# Patient Record
Sex: Male | Born: 1935 | Race: White | Hispanic: No | Smoking: Never smoker
Health system: Southern US, Community
[De-identification: ages and names within clinical notes are randomized; demographics above are authoritative.]

## PROBLEM LIST (undated history)

## (undated) DIAGNOSIS — Z9289 Personal history of other medical treatment: Secondary | ICD-10-CM

## (undated) DIAGNOSIS — I252 Old myocardial infarction: Secondary | ICD-10-CM

## (undated) DIAGNOSIS — E785 Hyperlipidemia, unspecified: Secondary | ICD-10-CM

## (undated) DIAGNOSIS — I10 Essential (primary) hypertension: Secondary | ICD-10-CM

## (undated) DIAGNOSIS — I2510 Atherosclerotic heart disease of native coronary artery without angina pectoris: Secondary | ICD-10-CM

## (undated) DIAGNOSIS — I4891 Unspecified atrial fibrillation: Secondary | ICD-10-CM

## (undated) DIAGNOSIS — I639 Cerebral infarction, unspecified: Secondary | ICD-10-CM

## (undated) HISTORY — DX: Atherosclerotic heart disease of native coronary artery without angina pectoris: I25.10

## (undated) HISTORY — DX: Personal history of other medical treatment: Z92.89

## (undated) HISTORY — DX: Unspecified atrial fibrillation: I48.91

## (undated) HISTORY — DX: Cerebral infarction, unspecified: I63.9

## (undated) HISTORY — DX: Hyperlipidemia, unspecified: E78.5

## (undated) HISTORY — DX: Essential (primary) hypertension: I10

## (undated) HISTORY — DX: Old myocardial infarction: I25.2

## (undated) HISTORY — PX: EYE SURGERY: SHX253

---

## 1998-08-20 HISTORY — PX: PROSTATE SURGERY: SHX751

## 2000-02-26 ENCOUNTER — Encounter: Payer: Self-pay | Admitting: Urology

## 2000-03-01 ENCOUNTER — Encounter (INDEPENDENT_AMBULATORY_CARE_PROVIDER_SITE_OTHER): Payer: Self-pay | Admitting: Specialist

## 2000-03-01 ENCOUNTER — Inpatient Hospital Stay (HOSPITAL_COMMUNITY): Admission: RE | Admit: 2000-03-01 | Discharge: 2000-03-03 | Payer: Self-pay | Admitting: Urology

## 2003-08-21 HISTORY — PX: HEMORRHOID SURGERY: SHX153

## 2004-07-12 ENCOUNTER — Inpatient Hospital Stay (HOSPITAL_COMMUNITY): Admission: EM | Admit: 2004-07-12 | Discharge: 2004-07-14 | Payer: Self-pay | Admitting: Emergency Medicine

## 2004-07-14 ENCOUNTER — Encounter: Payer: Self-pay | Admitting: Cardiology

## 2004-08-09 ENCOUNTER — Ambulatory Visit (HOSPITAL_COMMUNITY): Admission: RE | Admit: 2004-08-09 | Discharge: 2004-08-09 | Payer: Self-pay | Admitting: Cardiology

## 2007-09-26 ENCOUNTER — Encounter (INDEPENDENT_AMBULATORY_CARE_PROVIDER_SITE_OTHER): Payer: Self-pay | Admitting: General Surgery

## 2007-09-26 ENCOUNTER — Ambulatory Visit (HOSPITAL_COMMUNITY): Admission: RE | Admit: 2007-09-26 | Discharge: 2007-09-26 | Payer: Self-pay | Admitting: General Surgery

## 2008-09-17 ENCOUNTER — Inpatient Hospital Stay (HOSPITAL_COMMUNITY): Admission: EM | Admit: 2008-09-17 | Discharge: 2008-09-20 | Payer: Self-pay | Admitting: Emergency Medicine

## 2008-09-17 DIAGNOSIS — I252 Old myocardial infarction: Secondary | ICD-10-CM

## 2008-09-17 HISTORY — PX: CORONARY ANGIOPLASTY: SHX604

## 2008-09-17 HISTORY — DX: Old myocardial infarction: I25.2

## 2008-10-07 ENCOUNTER — Encounter (HOSPITAL_COMMUNITY): Admission: RE | Admit: 2008-10-07 | Discharge: 2009-01-05 | Payer: Self-pay | Admitting: Cardiology

## 2010-06-14 DIAGNOSIS — Z9289 Personal history of other medical treatment: Secondary | ICD-10-CM

## 2010-06-14 HISTORY — DX: Personal history of other medical treatment: Z92.89

## 2010-06-29 HISTORY — PX: TRANSTHORACIC ECHOCARDIOGRAM: SHX275

## 2010-11-30 LAB — GLUCOSE, CAPILLARY
Glucose-Capillary: 76 mg/dL (ref 70–99)
Glucose-Capillary: 128 mg/dL — ABNORMAL HIGH (ref 70–99)
Glucose-Capillary: 135 mg/dL — ABNORMAL HIGH (ref 70–99)
Glucose-Capillary: 148 mg/dL — ABNORMAL HIGH (ref 70–99)

## 2010-12-04 LAB — CBC
HCT: 30 % — ABNORMAL LOW (ref 39.0–52.0)
HCT: 32.2 % — ABNORMAL LOW (ref 39.0–52.0)
HCT: 33.7 % — ABNORMAL LOW (ref 39.0–52.0)
HCT: 35 % — ABNORMAL LOW (ref 39.0–52.0)
Hemoglobin: 10.2 g/dL — ABNORMAL LOW (ref 13.0–17.0)
Hemoglobin: 10.6 g/dL — ABNORMAL LOW (ref 13.0–17.0)
Hemoglobin: 11.4 g/dL — ABNORMAL LOW (ref 13.0–17.0)
Hemoglobin: 11.9 g/dL — ABNORMAL LOW (ref 13.0–17.0)
MCHC: 33.8 g/dL (ref 30.0–36.0)
MCHC: 33.9 g/dL (ref 30.0–36.0)
MCHC: 34 g/dL (ref 30.0–36.0)
MCV: 95.6 fL (ref 78.0–100.0)
MCV: 97.8 fL (ref 78.0–100.0)
Platelets: 107 10*3/uL — ABNORMAL LOW (ref 150–400)
Platelets: 145 10*3/uL — ABNORMAL LOW (ref 150–400)
RBC: 3.14 MIL/uL — ABNORMAL LOW (ref 4.22–5.81)
RBC: 3.45 MIL/uL — ABNORMAL LOW (ref 4.22–5.81)
RBC: 3.67 MIL/uL — ABNORMAL LOW (ref 4.22–5.81)
RDW: 14 % (ref 11.5–15.5)
RDW: 14.1 % (ref 11.5–15.5)
RDW: 14.3 % (ref 11.5–15.5)
WBC: 5.1 10*3/uL (ref 4.0–10.5)
WBC: 6.1 10*3/uL (ref 4.0–10.5)
WBC: 6.2 10*3/uL (ref 4.0–10.5)

## 2010-12-04 LAB — COMPREHENSIVE METABOLIC PANEL
ALT: 14 U/L (ref 0–53)
ALT: 17 U/L (ref 0–53)
AST: 40 U/L — ABNORMAL HIGH (ref 0–37)
AST: 60 U/L — ABNORMAL HIGH (ref 0–37)
Albumin: 3.2 g/dL — ABNORMAL LOW (ref 3.5–5.2)
Alkaline Phosphatase: 48 U/L (ref 39–117)
Alkaline Phosphatase: 49 U/L (ref 39–117)
BUN: 17 mg/dL (ref 6–23)
CO2: 23 mEq/L (ref 19–32)
CO2: 24 mEq/L (ref 19–32)
Calcium: 8 mg/dL — ABNORMAL LOW (ref 8.4–10.5)
Chloride: 106 mEq/L (ref 96–112)
Chloride: 106 mEq/L (ref 96–112)
Creatinine, Ser: 1.11 mg/dL (ref 0.4–1.5)
GFR calc Af Amer: >60 mL/min (ref >60)
GFR calc Af Amer: >60 mL/min (ref >60)
GFR calc non Af Amer: >60 mL/min (ref >60)
GFR calc non Af Amer: >60 mL/min (ref >60)
Glucose, Bld: 111 mg/dL — ABNORMAL HIGH (ref 70–99)
Potassium: 3.3 mEq/L — ABNORMAL LOW (ref 3.5–5.1)
Sodium: 136 mEq/L (ref 135–145)
Sodium: 137 mEq/L (ref 135–145)
Total Bilirubin: 1.5 mg/dL — ABNORMAL HIGH (ref 0.3–1.2)
Total Bilirubin: 1.5 mg/dL — ABNORMAL HIGH (ref 0.3–1.2)
Total Protein: 5.2 g/dL — ABNORMAL LOW (ref 6.0–8.3)

## 2010-12-04 LAB — CK TOTAL AND CKMB (NOT AT ARMC)
CK, MB: 32.6 ng/mL — ABNORMAL HIGH (ref 0.3–4.0)
CK, MB: 70.5 ng/mL — ABNORMAL HIGH (ref 0.3–4.0)
CK, MB: 74.4 ng/mL — ABNORMAL HIGH (ref 0.3–4.0)
Relative Index: 10.8 — ABNORMAL HIGH (ref 0.0–2.5)
Relative Index: 11.5 — ABNORMAL HIGH (ref 0.0–2.5)
Relative Index: 6.5 — ABNORMAL HIGH (ref 0.0–2.5)
Total CK: 505 U/L — ABNORMAL HIGH (ref 7–232)
Total CK: 614 U/L — ABNORMAL HIGH (ref 7–232)
Total CK: 687 U/L — ABNORMAL HIGH (ref 7–232)

## 2010-12-04 LAB — BASIC METABOLIC PANEL
BUN: 23 mg/dL (ref 6–23)
CO2: 26 mEq/L (ref 19–32)
Calcium: 8.6 mg/dL (ref 8.4–10.5)
Chloride: 105 mEq/L (ref 96–112)
Creatinine, Ser: 1.39 mg/dL (ref 0.4–1.5)
GFR calc Af Amer: 59 mL/min — ABNORMAL LOW (ref >60)
GFR calc non Af Amer: 49 mL/min — ABNORMAL LOW (ref >60)
Glucose, Bld: 118 mg/dL — ABNORMAL HIGH (ref 70–99)
Potassium: 3.8 mEq/L (ref 3.5–5.1)
Sodium: 138 mEq/L (ref 135–145)

## 2010-12-04 LAB — TROPONIN I
Troponin I: 18.28 ng/mL (ref 0.00–0.06)
Troponin I: 27.47 ng/mL (ref 0.00–0.06)
Troponin I: 33.1 ng/mL (ref 0.00–0.06)

## 2010-12-04 LAB — PROTIME-INR
INR: 1.9 — ABNORMAL HIGH (ref 0.00–1.49)
INR: 2.1 — ABNORMAL HIGH (ref 0.00–1.49)
INR: 2.3 — ABNORMAL HIGH (ref 0.00–1.49)
Prothrombin Time: 23.1 seconds — ABNORMAL HIGH (ref 11.6–15.2)
Prothrombin Time: 24.9 seconds — ABNORMAL HIGH (ref 11.6–15.2)
Prothrombin Time: 26.7 seconds — ABNORMAL HIGH (ref 11.6–15.2)

## 2010-12-04 LAB — GLUCOSE, CAPILLARY
Glucose-Capillary: 100 mg/dL — ABNORMAL HIGH (ref 70–99)
Glucose-Capillary: 110 mg/dL — ABNORMAL HIGH (ref 70–99)
Glucose-Capillary: 121 mg/dL — ABNORMAL HIGH (ref 70–99)
Glucose-Capillary: 133 mg/dL — ABNORMAL HIGH (ref 70–99)
Glucose-Capillary: 156 mg/dL — ABNORMAL HIGH (ref 70–99)
Glucose-Capillary: 90 mg/dL (ref 70–99)
Glucose-Capillary: 92 mg/dL (ref 70–99)

## 2010-12-04 LAB — HEMOGLOBIN AND HEMATOCRIT, BLOOD: Hemoglobin: 10.3 g/dL — ABNORMAL LOW (ref 13.0–17.0)

## 2010-12-04 LAB — POCT CARDIAC MARKERS
CKMB, poc: <1 ng/mL — ABNORMAL LOW (ref 1.0–8.0)
Myoglobin, poc: 108 ng/mL (ref 12–200)
Troponin i, poc: <0.05 ng/mL (ref 0.00–0.09)

## 2010-12-04 LAB — APTT: aPTT: 29 seconds (ref 24–37)

## 2010-12-05 LAB — GLUCOSE, CAPILLARY
Glucose-Capillary: 84 mg/dL (ref 70–99)
Glucose-Capillary: 84 mg/dL (ref 70–99)
Glucose-Capillary: 91 mg/dL (ref 70–99)
Glucose-Capillary: 135 mg/dL — ABNORMAL HIGH (ref 70–99)
Glucose-Capillary: 90 mg/dL (ref 70–99)

## 2010-12-05 LAB — LIPID PANEL
HDL: 43 mg/dL (ref >39)
LDL Cholesterol: 53 mg/dL (ref 0–99)
Total CHOL/HDL Ratio: 2.4 RATIO
VLDL: 9 mg/dL (ref 0–40)

## 2010-12-05 LAB — CBC
Hemoglobin: 10.4 g/dL — ABNORMAL LOW (ref 13.0–17.0)
RBC: 3.21 MIL/uL — ABNORMAL LOW (ref 4.22–5.81)
RDW: 13.9 % (ref 11.5–15.5)

## 2010-12-05 LAB — COMPREHENSIVE METABOLIC PANEL
ALT: 15 U/L (ref 0–53)
AST: 25 U/L (ref 0–37)
Alkaline Phosphatase: 48 U/L (ref 39–117)
CO2: 24 mEq/L (ref 19–32)
Glucose, Bld: 85 mg/dL (ref 70–99)
Potassium: 3.5 mEq/L (ref 3.5–5.1)
Sodium: 138 mEq/L (ref 135–145)
Total Protein: 5.4 g/dL — ABNORMAL LOW (ref 6.0–8.3)

## 2010-12-05 LAB — MAGNESIUM: Magnesium: 2.1 mg/dL (ref 1.5–2.5)

## 2011-01-02 NOTE — Discharge Summary (Signed)
Craig Richard, Craig Richard NO.:  [id number]   MEDICAL RECORD NO.:  [id number]          PATIENT TYPE:  INP   LOCATION:  2006                         FACILITY:  [facility]   PHYSICIAN:  Nanetta Batty, M.D.   DATE OF BIRTH:  [birth year]   DATE OF ADMISSION:  09/17/2008  DATE OF DISCHARGE:  09/20/2008                               DISCHARGE SUMMARY   DISCHARGE DIAGNOSES:  1. ST elevation anterior myocardial infarction.  2. Coronary artery disease single-vessel of left anterior descending,      undergoing percutaneous transluminal coronary angioplasty only with      100% stenosis of the left anterior descending.  3. Residual 50% left anterior descending stenosis.  4. Mild hypokinesis of left ventricular ejection fraction of 40% at      cardiac catheterization.  5. Chronic atrial fibrillation, on Coumadin therapy.  6. Dyslipidemia, treated and controlled.  7. Non-insulin dependent diabetes mellitus 2, stable.  8. Hypertension.  9. Tachycardia with ambulation, now resolved with increased dose of      beta-blocker.  10.Hypokalemia, resolved.  11.Thrombocytopenia.  Unsure, if this is secondary to heparin,      anticoagulation, or if this is an old problem.  12.Mild dysmotility secondary to myocardial infarction.  Family to      assist with care of his wife.   DISCHARGE MEDICATIONS:  1. Aspirin 81 mg daily.  2. Stop atenolol.  3. Lopressor 50 mg tablet 1-1/2 tablets twice a day.  4. Imdur 60 mg once daily.  5. Actos 15 mg daily.  6. Lipitor 40 mg 1 every other day.  7. Altace 10 mg daily.  8. Coumadin 5 mg daily except 2.5 mg Monday, Wednesday, and Friday.  9. Nitroglycerin 1/150, under the tongue as needed for chest pain      while sitting.  No more than 1 every 5 minutes up to 3/15 minutes.      If no relief, call 911.  10.Coumadin level drawn on Wednesday, home health will draw.   Please have your wife's physician call home health care consult for her  to assist with  your care while you are unable to manage.   DISCHARGE INSTRUCTIONS:  1. Low-sodium, heart-healthy, and diabetic diet.  2. Wash cath site with soap and water.  Call if any bleeding,      swelling, or drainage.  3. Increase activity slowly.  May shower.  No lifting for 2 weeks.  No      driving for 2 weeks.  4. Follow up with Dr. Aleen Campi, September 30, 2008 at 4 p.m.   HISTORY OF PRESENT ILLNESS:  This is an 77 year old gentleman presented  to the emergency room on September 17, 2008 with complaints of chest pain.  Pain developed after he had taken his wife back to bed.  After helping  her to the bathroom substernal chest pain, discomfort, and diaphoresis;  911 was called and pain was 8/10 initially.  Originally, his code STEMI  was called, but cancelled by the ER physician.  Chest pain was down to  1/10 after 3 sublingual nitro, but repeat EKG continued to  reveal 1 mm  ST elevation.  Dr. Allyson Sabal was notified.  The patient was placed on IV  nitroglycerin, IV Lopressor was given, aspirin and morphine.  The  patient was taken to the cath lab for urgent cardiac cath.  INR was 1.9.   ALLERGIES:  No known allergies.   PAST MEDICAL HISTORY:  1. He has chronic atrial fibrillation.  2. Hypertension.  3. Elevated cholesterol.  4. Diabetes mellitus.  5. He is having large prostate with surgery and hemorrhoidectomy in      the past.   FAMILY HISTORY:  No coronary artery disease and the patient himself has  never had cardiac problems.   SOCIAL HISTORY:  He stopped tobacco 40 years ago.  He is care Nathyn Luiz  for his wife and has to do physical activity to assist her.   FAMILY HISTORY/REVIEW OF SYSTEMS:  See H and P.   PHYSICAL EXAMINATION ON DISCHARGE:  VITAL SIGNS:  Blood pressure 129/86,  pulse 81, and oxygen saturation good.  With ambulation, his pulse did go  up to 148 with initial ambulation.  We did increase his Lopressor to 75  b.i.d. with followup ambulation.  After the increased dose  of  medication, his heart rate stayed 100-115 at that point.  Cardiac rehab  did ambulate him initially and discussed Cardiac Rehab proper use of  sublingual nitroglycerin, 911, etc.  HEART:  Irregularly irregular.  LUNGS:  Clear.   LABORATORY DATA:  Hemoglobin on admission 11.9 with hematocrit of 35,  WBC 5.1, and platelets were 145.  With the anticoagulation, platelets  dropped to 107 after Angiomax.  Heparin was not restarted because follow  up INRs were elevated at 2.32.  When the INR dropped for sheath pulling,  Coumadin was restarted.  Hemoglobin at discharge 10.4, hematocrit 30.9,  WBC 5.4, and platelets 112.   CHEMISTRY:  Sodium 138.  On admission; potassium 3.8, chloride 105, CO2  of 26, BUN 23, creatinine 1.39, and glucose 118.  At discharge; sodium  138, potassium 3.5, chloride 106, CO2 of 24, BUN 16, creatinine 1.2, and  glucose 85.   Coags on admission; INR was 1.9, the lowest we got during  hospitalization was 1.9 on the September 18, 2008, and at discharge  Protime 30.3 and INR of 2.7.   Magnesium level was 2.1 and calcium 8.2.  BNP was 390.   Chest x-ray on admission; cardiomegaly with bowel to acute disease.   Accu-Cheks during hospitalization were fairly well controlled mostly  less than 120.   EKGs.  Multiple EKGs were done, but initial and shows more elevated T-  wave in lead V4-5 and an ST elevation in lead II, III, and aVF.  He also  has a reciprocal or inverted T-wave in lead V2.   Continued EKGs revealed elevated STs in II, III, aVF, and somewhat more  so in V4 and V5.  These continued to progress.  Please note, all these  were atrial fibrillation as well.   Postprocedure EKG with an inverted T-waves in V3-V4.  T-wave in lead  III, which actually has been present.  Last EKG was, September 20, 2008,  inferior infarction, T-wave inversions in V4, V5, and V6.   HOSPITAL COURSE:  The patient was admitted by Dr. Allyson Sabal on-call for Dr.  Aleen Campi.  After  presenting by EMS with chest pain, mostly relieved with  nitroglycerin, but continued 1/10.  EKG continue with ST elevation.  The  patient was taken emergently to the cath lab  for cardiac catheterization  and found to have 100% stenosis of LAD, more distal underwent  angioplasty; however, the patient with 85 and has a history of chronic  atrial fibrillation.  The patient tolerated the procedure well and was  admitted to the intensive care unit where he slowly progressed over the  next 3 days.  Sheath was held for a 24 hours secondary to elevated INR.  It was pulled on September 18, 2008, and the patient was up in the chair  later that day.   MEDICATIONS:  Beta-blocker had been started.  His Lipitor continued.  Altace, we will increase the dose once his creatinine stayed stable  after his cardiac catheterization.   He continued to improve by September 20, 2008.  He ambulated.  Heart rate  did increase.  His Lopressor was adjusted and by the afternoon of  September 20, 2008, he was stable ready for discharge.  His daughters were  here to take him home.  They actually have contacted home health for  their mother's assistance, so that he will not have to do any heavy work  while he is recuperating.  He does plan to attend cardiac rehab phase  II.  He will follow up as an outpatient with Dr. Aleen Campi, his primary  cardiologist.      Darcella Gasman. Annie Paras, N.P.      Nanetta Batty, M.D.  Electronically Signed    LRI/MEDQ  D:  09/20/2008  T:  09/21/2008  Job:  [id number]   cc:   Nanetta Batty, M.D.  Antionette Char, MD  Georgianne Fick, M.D.

## 2011-01-02 NOTE — Op Note (Signed)
Craig Richard, Craig Richard             ACCOUNT NO.:  [id number]   MEDICAL RECORD NO.:  [id number]          PATIENT TYPE:  AMB   LOCATION:  DAY                          FACILITY:  [facility]   PHYSICIAN:  Timothy E. Earlene Plater, M.D. DATE OF BIRTH:  May 27, 1922   DATE OF PROCEDURE:  09/26/2007  DATE OF DISCHARGE:                               OPERATIVE REPORT   PREOPERATIVE DIAGNOSES:  Internal and external hemorrhoids.   POSTOPERATIVE DIAGNOSES:  Internal and external hemorrhoids.   PROCEDURE:  Hemorrhoidectomy, simple.   ANESTHESIA:  [place].   SURGEON:  Lorelee New, MD.   [name] is a long-term patient of mine. I have seen him and treated  him of for hemorrhoids. We have in the past managed to take care of  things with office procedures however, he has progressed now with a  prolapsing hemorrhoids which cause pain, soilage, bleeding, difficulty  cleansing and after careful discussion, he wishes to proceed with  surgery. We have talked about that in detail. Also because of this  medical conditions and chronic Coumadin that will have to be adjusted  very carefully as well.  He agrees and understands. He presents today.  He is identified and permit signed. His Coumadin and Lovenox have been  handled appropriately.  He is ready for surgery.   The patient is taken to the operating room, placed supine,  IV sedation  given.  He was gently placed in lithotomy.  The perianal area was  inspected, prepped and draped in the usual fashion.  The anus was  injected around and about with Marcaine, epinephrine and Wydase and the  anus gently dilated.  With the operating anoscope in place, there was a  third-degree anterior hemorrhoid but without any anodermal or external  component and a third-degree right posterior with skin and anodermal  components. The left side was clear.  I put a band on the anterior  hemorrhoids since it was purely internal. It fit nicely and there were  no complications. I  excised the right posterior hemorrhoid in its  entirety,  undermined the edges,  removed the superficial varicosities  and closed the wound with a running 2-0 chromic. Again no bleeding, no  complication.  Gelfoam gauze wrapped in Surgicel was placed in the anal  canal and he was removed to the recovery room in good condition.  All  counts correct.   Prior to anesthesia, I went over the details of the care again with him  carefully, wrote them down for him carefully, gave him Vicodin for pain.  He will resume Lovenox in 2 days and Coumadin in 7.      Timothy E. Earlene Plater, M.D.  Electronically Signed    TED/MEDQ  D:  09/26/2007  T:  09/27/2007  Job:  [id number]   cc:   [facility]

## 2011-01-02 NOTE — Cardiovascular Report (Signed)
NAMEGILDARDO, Craig Richard NO.:  [id number]   MEDICAL RECORD NO.:  [id number]          PATIENT TYPE:  INP   LOCATION:  2311                         FACILITY:  [facility]   PHYSICIAN:  Nanetta Batty, M.D.   DATE OF BIRTH:  Jun 12, 1922   DATE OF PROCEDURE:  DATE OF DISCHARGE:                            CARDIAC CATHETERIZATION   Craig Richard is a delightful 77 year old married white male, history of  chronic AFib on Coumadin anticoagulation.  He developed chest pain early  this morning and was brought to [facility] where he was found to have  subtle ST-segment elevation in the inferior leads.  His INR was 1.9.  He  was treated with IV nitro, morphine, and Lopressor.  His pain for the  most part subsided, though this ST-segment elevation appeared to be more  pronounced.  He presents now for urgent cardiac catheterization and  potential intervention.   DESCRIPTION OF PROCEDURE:  The patient was brought to the Second Floor  [facility] Cardiac Cath Lab urgently in the postabsorptive state.  He  was not premedicated.  His right groin was prepped and shaved in the  usual sterile fashion.  Xylocaine 1% was used for local anesthesia.  A 6-  French sheath was inserted into the right femoral artery using standard  Seldinger technique.  A 6-French right and left Judkins diagnostic  catheters as well as a 6-French pigtail catheter were used for selective  cholangiography and left ventriculography respectively.  Visipaque dye  was used for the entirety of the case.  Retrograde aortic, ventricular,  and pulmonic blood pressures were recorded.   HEMODYNAMICS:  1. Aortic systolic pressure 126, diastolic pressure 73.  2. Left ventricular systolic pressure 134 and diastolic pressure 12.  3. Selective coronary angiography.      a.     Left main normal.      b.     LAD; the LAD had a 50% hypodense lesion in the midportion at       the takeoff of the second moderate-sized diagonal branch.   The       distal LAD just above the apex was abruptly occluded.      c.     Circumflex; free of significant disease.      d.     Right coronary artery; dominant and free of significant       disease.  4. Left ventriculography; RAO left ventriculogram was performed using      35 mL of Visipaque dye 12 mL per second.  There was anteroapical      and inferoapical akinesia with EF in the 40-45% range.   IMPRESSION:  Craig Richard has distal LAD infarct in a wraparound LAD  accounting for his inferior ST-segment elevation with anteroapical  akinesia.  This appears to be abrupt cutoff and looks more  thromboembolic than does atherosclerotic.  We will proceed with  percutaneous intervention to restore antegrade flow and preserve his  apex.   DESCRIPTION OF PROCEDURE:  The patient received aspirin, Plavix 600, and  Angiomax bolus with an ACT of 405.  Using a 6-French JL  3.5 guide  catheter along with and 0.014 x 190 [employer] soft wire and a 2.0 x 12 apex  balloon.  PTCA was performed of the apical LAD at low pressures (3  atmospheres).  This resulted in restoration of the antegrade flow.  There was no dissection or extravasation.  The patient tolerated the  procedure well.   IMPRESSION:  Successful distal apical left anterior descending coronary  artery percutaneous transluminal coronary angioplasty in the setting of  an inferior ST-elevation myocardial infarction secondary to a wraparound  left anterior descending coronary artery.   The vessel was too small to stent.  The guidewire and catheter were  removed.  The sheath was then secured in place.  Angiomax was turned  off.  The sheath will be removed later this afternoon given the INR of  1.9.  He will be treated with the usual drugs including beta-blocker,  statin, and ACE inhibitor.  Since the stent was not deployed, I believe  that continued coumadinization and aspirin would be appropriate.  Coumadin is on hold and heparin will be  restarted 4 hours after sheath  removal and the patient will ultimately need to re-coumadinized.   The patient left the lab in stable condition.      Nanetta Batty, M.D.  Electronically Signed     JB/MEDQ  D:  09/17/2008  T:  09/17/2008  Job:  [id number]   cc:   [facility]  Second Floor [facility] Cardiac Catheterization Laboratory  Antionette Char, MD

## 2011-01-05 NOTE — H&P (Signed)
NAMEAVIGDOR, Craig Richard NO.:  [id number]   MEDICAL RECORD NO.:  [id number]          PATIENT TYPE:  EMS   LOCATION:  MAJO                         FACILITY:  [facility]   PHYSICIAN:  Hettie Holstein, D.O.    DATE OF BIRTH:  June 18, 1922   DATE OF ADMISSION:  07/12/2004  DATE OF DISCHARGE:                                HISTORY & PHYSICAL   PRIMARY CARE PHYSICIAN:  Georgianne Fick, M.D.   CARDIOLOGIST:  Antionette Char, MD   CHIEF COMPLAINT:  Transfer from Urgent Care Center for which she had  initially presented with a fall due to lower extremity weakness, acute.   HISTORY OF PRESENT ILLNESS:  This is a pleasant 77 year old Caucasian male  with good functional status who presented to the Urgent Care Center today  after he had fallen while ambulating through his hallway at home. He states  that all of a sudden his legs felt weak and he had no complaints of  dizziness, lightheadedness, chest pain, palpitations, or shortness of  breath. He simply felt weak. This is transient, only lasting a few minutes.  He was subsequently able to get up off the floor. He had no slurred speech  or drooling reported by his wife who was there, and no specific asymmetrical  weakness. His wife is wheelchair bound and presented to help him off the  floor. He subsequently presented to the Urgent Care Center at which time Dr.  Cleta Alberts assessed him and noted him to have an irregular heart rate and directed  him to [facility] ER for admission. In the emergency department he was noted  to have an irregular heart rate in addition to atrial fibrillation on his  EKG. He reports having had a recent stress test about a few weeks ago per  Dr. Charolette Child. He was told that he had slight risk, but we do not have  these records available at this time. I am going to request these be faxed  to our office from Dr. Adelene Idler and Dr. Carolyn Stare office when they  receive this dictation. In any event, he has no  complaints at this time. He  is able to stand. He has no focal weakness. He is being admitted for further  evaluation and management.   PAST MEDICAL HISTORY:  Hypertension for a year, on treatment. Diabetes  diagnosed for a year without known retinopathy, neuropathy, or nephropathy.  He follows his CBGs on a weekly basis and he has a history of  hypercholesterolemia for about one and a half years.  He had a recent stress  test about six weeks ago.  He was told it was slight risk and to follow up  in one year with Dr. Aleen Campi. He has had prostate surgery about three years  ago by Dr. Logan Bores. He has a history of colonoscopy three to four years ago by  Dr. Scotty Court with no significant findings according to him.  His  immunization history is as follows: He typically has a flu shot yearly, but  he has not had one this year. Pneumovax--he seems to think he has had  one  about one or two years ago, but he cannot recall.   MEDICATIONS:  He currently takes Avandia 4 mg daily, aspirin 81 mg daily,  Lipitor 20 mg every other day, atenolol 50 mg daily.   ALLERGIES:  He has no known drug allergies.   SOCIAL HISTORY:  He is a nonsmoker. He does not drink alcohol.  He is  retired. He formerly worked at a company called [employer]. His  wife is wheelchair bound. He does have two children, one daughter  accompanying him now.   FAMILY HISTORY:  Significant for mother who died in her 12s of a CVA. Father  died in his mid 63s of an abdominal aortic aneurysm.   REVIEW OF SYSTEMS:  He denies any nausea, vomiting, diarrhea, chest pain,  shortness of breath. He had some slight palpitations, though this is not  prominent. He cannot recall when he first noticed these. He had no abdominal  pain. No hematochezia, melena, coffee-ground emesis, lower extremity,  swelling, or edema that he can recall. No calf tenderness. No dysuria.   PHYSICAL EXAMINATION:  VITAL SIGNS: Blood pressure 163/83, heart rate  in the  80s and 90 and irregular, O2 saturation 98% on room air.  GENERAL: Alert and oriented times four, in no acute distress.  CARDIAC: Heart rate irregular with normal S1 and S2. No appreciable murmurs.  LUNGS: Clear to auscultation bilaterally.  ABDOMEN: Soft, nontender. Obese. No hepatosplenomegaly. No suprapubic  tenderness. No costovertebral angle tenderness.  EXTREMITIES: No edema. No clubbing, cyanosis, or edema. Peripheral pulses  are symmetrical and palpable throughout.  NEUROLOGIC: No focal neurologic deficit. He is euthymic and answers all  questions appropriately.   LABORATORY DATA:  Point of care at 17:03 was negative. His INR was 1.0. WBC  is 5.9, hemoglobin 12.6, platelet count 172,000, MCV 93. Chemistry reveals  sodium 138, potassium 4.1, BUN 16, creatinine 1.1, glucose 102. His AST and  ALT were within normal limits. His BNP was mildly elevated in the 200s.   Chest x-ray revealed some cardiomegaly with mild vascular congestion,  questionable interstitial edema, right lower lobe nodular density,  questionable on portable x-ray and recommend two-view to clarify. EKG  revealed atrial fibrillation/flutter with controlled rate.   IMPRESSION:  1.  New onset atrial fibrillation. Rate is controlled. We will initiate low-      molecular weight and therapeutic Coumadin.  2.  Transient leg weakness. Certainly this could have been a transient      ischemic attack. We will check his CT scan. Rule out acute      cerebrovascular accident prior to administering Lovenox.  3.  Diabetes mellitus. Appears to be controlled. Will continue his home      regimen.  4.  Hypercholesterolemia. Will continue his Lipitor.  5.  Hypertension, poorly controlled. Will initiate Altace.   PLAN:  At this time we are going to admit Mr. Dorian to telemetry and  follow his course clinically and initiate low-molecular weight heparin bridge to a therapeutic INR. We will order hemoglobin A1C, fasting  lipid  profile, and 2-D echocardiogram. His BMP is mildly elevated to 200 and his  chest x-ray reveals some mild vascular congestion. Will order a 2-D  echocardiogram to further evaluate his LV function.      Eric   ESS/MEDQ  D:  07/12/2004  T:  07/12/2004  Job:  [id number]   cc:   Georgianne Fick, M.D.  [address]  [place]  [place]  [address]  Fax: [phone]

## 2011-01-05 NOTE — H&P (Signed)
[facility]  Patient:    Craig Richard, Craig Richard                    MRN: [id number] Proc. Date: 03/01/00 Adm. Date:  [id number] Disc. Date: [id number] Attending:  Londell Moh CCLeroy Sea., M.D.                         History and Physical  ADMITTING DIAGNOSIS:  Benign prostatic hypertrophy with bladder outlet obstruction.  SECONDARY DIAGNOSIS: Chronic prostatitis.  PROCEDURE:  Cystoscopy and transurethral resection of the prostate.  SURGEON:  Jamison Neighbor, M.D.  HISTORY OF PRESENT ILLNESS:  This 77 year old male has had problems with bladder outlet obstruction, as well as an elevated PSA.  The patient was treated for chronic prostatitis.  The patients ultrasound showed a large volume of 86, and because of the low PSA density and high PSA too, it was felt that a biopsy was not indicated.  The patient had significant bladder outlet symptoms, not responsive to treatment like saw palmetto.  The patient was treated with Proscar, with only minimal improvement.  The patient strongly did not wish to have surgery.  He was carefully advised that TUNA, Targis, and other options were available.  The patient had a prolonged bout of prostatitis and urinary retention, and became quite frustrated, and spoke to his brother, a Development worker, international aid, who recommended a TURP, having had one himself.  The patient decided he would prefer to do this, as opposed to other options.  The patient is to be admitted following the TURP.  PAST MEDICAL/SURGICAL HISTORY:  Quite unremarkable.  The patient has no chronic  medical illness.  He takes nothing other than aspirin and vitamin E.  He has had no previous surgery.  The patient did have a hospitalization for atypical chest pain, which turned out to be from a hiatal hernia.  SOCIAL HISTORY:  He is married and has two grown children.  The social history s noncontributory.  The patient has not smoked in  30 years.  He has very rare alcohol.  FAMILY HISTORY:  His father died of an abdominal aortic aneurysm.  His mother died of a stroke.  There is a history of chronic obstructive pulmonary disease in the family (a sister), as well as a history of seizures in a daughter and his mother, who had a stroke.  PHYSICAL EXAMINATION:  GENERAL:  He is a well-developed, well-nourished male, in no acute distress.  HEENT:  Normocephalic, atraumatic.  Cranial nerves II-XII are grossly intact.  NECK:  Supple, no adenopathy or thyromegaly.  LUNGS:  Clear.  HEART:  A regular rate, rhythm without murmurs, thrills, gallops, rubs, or heaves.  ABDOMEN:  Soft, nontender.  No abdominal masses, rebound, or guarding.  GENITOURINARY:  Testicles are normal size and shape.  ______, cord structures are normal.  No evidence of hydrocele, spermatocele, varicocele, or hernia, or adenopathy.  RECTAL:  A 4+ prostate, slightly boggy from chronic prostatitis, but not hot or  nodular.  The sphincter tone was normal.  _______: Unremarkable vasculature.  NEUROLOGIC:  Normal.  IMPRESSION:  Benign prostatic hypertrophy by obstruction, and chronic prostatitis.  PLAN:  To admit following the transurethral resection of the prostate. DD:  03/01/00 TD:  03/01/00 Job: [id number]

## 2011-01-05 NOTE — Discharge Summary (Signed)
[facility]  Patient:    Craig Richard, Craig Richard                    MRN: [id number] Adm. Date:  [id number] Disc. Date: [id number] Attending:  Londell Moh CC:         Jamison Neighbor, M.D.             Leroy Sea., M.D.                           Discharge Summary  ADMITTING DIAGNOSES: 1. Benign prostatic hypertrophy with bladder outlet obstruction. 2. Prostatitis. 3. Hiatal hernia.  PRINCIPAL PROCEDURE:  Transurethral resection of the prostate done on March 01, 2000.  HISTORY:  This 77 year old male has had longstanding problems with bladder outlet obstruction.  The patient has been treated with oral agents without significant improvement.  He developed an episode of near retention during a recent trip and feels that he would like to undergo a TURP.  He was given other alternatives such as TUNA, ______ or prostatic stent and elected to undergo TURP.  He does understand that he has a very large gland that may require additional surgery.  He was admitted to the hospital following this TURP.  PAST MEDICAL HISTORY:  Patients past medical history is noncontributory and has been fully outlined in the initial history and physical.  HOSPITAL COURSE:  Patient was taken to the operating room on the day of admission where he underwent a successful TURP.  His postoperative course was unremarkable.  The patients urine cleared very nicely.  His Foley catheter was made ready for removal and on postoperative day #2, patient voided without difficulty after removal of the catheter and was discharged.  DISCHARGE INSTRUCTIONS:  He was given a prescription for pain medicine as well as antibiotics and to return in followup in two weeks time. DD:  03/12/00 TD:  03/15/00 Job: [id number]

## 2011-01-05 NOTE — Discharge Summary (Signed)
Craig Richard, Craig Richard NO.:  [id number]   MEDICAL RECORD NO.:  [id number]          PATIENT TYPE:  INP   LOCATION:  3705                         FACILITY:  [facility]   PHYSICIAN:  Mallory Shirk, MD     DATE OF BIRTH:  [birth year]   DATE OF ADMISSION:  07/12/2004  DATE OF DISCHARGE:  07/14/2004                                 DISCHARGE SUMMARY   PRIMARY CARE PHYSICIAN:  Antionette Char, M.D.   DISCHARGE DIAGNOSES:  1.  Atrial fibrillation, new onset.  2.  Diabetes mellitus.  3.  Hypertension.  4.  Transient weakness in lower extremities.   MEDICATIONS ON DISCHARGE:  1.  Avandia 4 mg p.o. daily.  2.  Atenolol 50 mg p.o. daily.  3.  Lipitor 20 mg p.o. daily.  4.  Aspirin 81 mg p.o. daily.  5.  Altace 2.5 mg p.o. daily.  6.  Lovenox 80 mg subcutaneously b.i.d. x 3 days.  7.  Coumadin 5 mg p.o. daily.  8.  Potassium chloride 20 mEq one tablet p.o. daily x 3 days.   FOLLOWUP APPOINTMENTS:  1.  Georgianne Fick, M.D., on Monday, July 17, 2004, for INR and BNP      check.  Dr. Nicholos Johns will adjust the Coumadin dose and write a new      prescription appropriately.  2.  Antionette Char, M.D., cardiology, in two weeks.  Dr. Adelene Idler office      will call with appointment.   HISTORY OF PRESENT ILLNESS:  Craig Richard is a very pleasant, 77 year old,  Caucasian gentleman with good functional status, who presented to urgent  care earlier today after he fell while ambulating through his hallway at  home.  He states that all of a sudden his legs felt weak.  No complaints of  dizziness, lightheadedness, chest pain, palpitations or shortness of breath.  No syncopal episodes.  He just simply felt weak, transient, lasting for a  few minutes.  He eased himself to the floor.  No trauma to the head at that  time.  He was subsequently able to get off of the floor on his own strength.  No slurred speech or drooling noted by his wife.  The patient presented to  urgent  care, at which time he was noted to have an irregular heart rate and  directed to the [facility] ER for evaluation.  In the ED, he was noted to  have atrial fibrillation which is of new onset.  He was seen by Antionette Char, M.D., a few weeks prior to this admission for a stress test.  He  was told he had a slight risk, but records are not available at this time.  At the time of examination, the patient had no difficulty in walking.  No  focal weakness was noted.  He was admitted for evaluation and further  management of the new onset atrial fibrillation.   PAST MEDICAL HISTORY:  1.  Hypertension.  2.  Diabetes mellitus.  3.  Hypercholesterolemia.   MEDICATIONS ON ADMISSION:  1.  Avandia 4 mg p.o. daily.  2.  Aspirin 81 mg p.o. daily.  3.  Lipitor 20 mg p.o. daily.  4.  Atenolol 50 mg p.o. daily.   ALLERGIES:  NKDA.   PHYSICAL EXAMINATION ON ADMISSION:  VITAL SIGNS:  Blood pressure 163/83,  pulse between 80-90 and irregular, O2 saturations 98% on room air.  GENERAL APPEARANCE:  An elderly Caucasian gentleman in no acute distress.  Alert and oriented x 3.  Cooperative with the exam.  HEENT:  Normocephalic and atraumatic.  PERRL.  NECK:  Supple.  No JVD.  No LAD.  LUNGS:  Clear to auscultation bilaterally.  No wheezes.  No rales.  CARDIOVASCULAR:  S1 plus S2.  Irregularly rate.  No murmurs, rubs or  gallops.  EXTREMITIES:  No cyanosis, clubbing or edema.  Good peripheral pulses  bilaterally.  NEUROLOGIC:  No focal neurologic deficits seen.   LABORATORIES ON ADMISSION:  Cardiac enzymes negative.  INR 1.0.  WBC 5.9,  hemoglobin 12.6, platelets 172,000, MCV 93.  Sodium 138, potassium 4.1, BUN  16, creatinine 1.0, glucose 102.  AST and ALT within normal limits.  BNP  206.   IMAGING:  Chest x-ray with cardiomegaly with some mild congestion and  questionable interstitial edema.   EKG with atrial fibrillation/flutter with rate control.   CT of the head negative for bleed or  other acute intracranial process, mild  diffuse parenchymal atrophy noted.   HOSPITAL COURSE:  The patient was admitted to a monitored bed.   #1 - ATRIAL FIBRILLATION:  The patient was started on Lovenox 85 mg  subcutaneously b.i.d. and Coumadin 5 mg p.o. daily.  On the day of  discharge, the patient's INR is 1.2 with a PTT of 14.6.  The patient will be  discharged with a three-day supply of Lovenox 80 mg subcutaneously b.i.d.  Pharmacy will provide education for the patient to use Lovenox.  Will be  seen by PCP, Dr. Nicholos Johns on Monday, July 17, 2004, for an INR  check.  Dr. Carolyn Stare office has been notified.  The patient's heart  rate was in the 80s-90s during his hospital stay with no events on the  monitor.   #2 - DIABETES MELLITUS:  The patient's Avandia was resumed.  He was also  placed on a sliding scale with Accu-Cheks a.c. and h.s.  His diabetes  mellitus was well controlled with an A1C of 6.7.   #3 - HYPERTENSION:  The patient's blood pressure was well controlled with  Altace and atenolol.   #4 - TRANSIENT LEG WEAKNESS:  Initially this was thought to be a TIA.  CT of  the head is negative.  The patient has had no episodes of weakness during  the hospital stay.  If this recurs, this may need further evaluation,  however, at this time no further evaluation is planned.   #5 - NODULAR OPACITY:  Chest x-ray showed a nodular opacity in the right  lung base.  The patient has no prior x-rays for comparison.  A short-term  interval followup chest x-ray has been recommended.  PCP, Dr. Nicholos Johns,  will do a followup chest x-ray in one month and evaluate further as needed.   The patient was advised to take his Coumadin and Lovenox as directed.  The  patient was also advised of the risks of Coumadin and told to avoid any  trauma or any activity that might leave him susceptible to trauma.  The patient understands these risks.  His wife has been on Coumadin for a  number  of years and he is aware of all of the possibilities.   The patient was also advised to return to the emergency department  immediately upon onset of weakness, dizziness, chest pain, shortness of  breath or any other symptoms that might need immediate medical attention.       GDK/MEDQ  D:  07/14/2004  T:  07/14/2004  Job:  [id number]   cc:   Georgianne Fick, M.D.  [address]  [place]  [place] [address]  Fax: [phone]   Antionette Char, MD  [address]  [place]  [place] [address]  Fax: [phone]

## 2011-01-05 NOTE — Discharge Summary (Signed)
NAMEMARCELLO, Craig Richard             ACCOUNT NO.:  [id number]   MEDICAL RECORD NO.:  [id number]          PATIENT TYPE:  INP   LOCATION:  3705                         FACILITY:  [facility]   PHYSICIAN:  Mallory Shirk, MD     DATE OF BIRTH:  Oct 19, 1921   DATE OF ADMISSION:  07/12/2004  DATE OF DISCHARGE:                                 DISCHARGE SUMMARY   ADDENDUM:  A 2-D echocardiogram showed good left and right ventricular  function.  Left and right atria are dilated.  There is some diastolic  dysfunction with left ventricular ejection fraction estimated to be 60%.       GDK/MEDQ  D:  07/14/2004  T:  07/14/2004  Job:  [id number]

## 2011-05-11 LAB — URINALYSIS, ROUTINE W REFLEX MICROSCOPIC
Bilirubin Urine: NEGATIVE
Glucose, UA: NEGATIVE
Hgb urine dipstick: NEGATIVE
Specific Gravity, Urine: 1.016
pH: 6.5

## 2011-05-11 LAB — HEMOGLOBIN AND HEMATOCRIT, BLOOD
HCT: 40
Hemoglobin: 13.5

## 2011-05-11 LAB — APTT: aPTT: 27

## 2011-05-11 LAB — PROTIME-INR: INR: 1

## 2011-05-11 LAB — BASIC METABOLIC PANEL
Chloride: 105
Creatinine, Ser: 1.26
GFR calc Af Amer: >60
Potassium: 3.7
Sodium: 141

## 2011-05-11 LAB — URINE MICROSCOPIC-ADD ON

## 2011-09-11 DIAGNOSIS — I4891 Unspecified atrial fibrillation: Secondary | ICD-10-CM | POA: Diagnosis not present

## 2011-09-11 DIAGNOSIS — Z7901 Long term (current) use of anticoagulants: Secondary | ICD-10-CM | POA: Diagnosis not present

## 2011-09-19 DIAGNOSIS — Z0000 Encounter for general adult medical examination without abnormal findings: Secondary | ICD-10-CM | POA: Diagnosis not present

## 2011-09-19 DIAGNOSIS — E785 Hyperlipidemia, unspecified: Secondary | ICD-10-CM | POA: Diagnosis not present

## 2011-09-19 DIAGNOSIS — E119 Type 2 diabetes mellitus without complications: Secondary | ICD-10-CM | POA: Diagnosis not present

## 2011-09-19 DIAGNOSIS — N390 Urinary tract infection, site not specified: Secondary | ICD-10-CM | POA: Diagnosis not present

## 2011-09-19 DIAGNOSIS — R5381 Other malaise: Secondary | ICD-10-CM | POA: Diagnosis not present

## 2011-09-26 DIAGNOSIS — E1129 Type 2 diabetes mellitus with other diabetic kidney complication: Secondary | ICD-10-CM | POA: Diagnosis not present

## 2011-09-26 DIAGNOSIS — E119 Type 2 diabetes mellitus without complications: Secondary | ICD-10-CM | POA: Diagnosis not present

## 2011-09-26 DIAGNOSIS — E782 Mixed hyperlipidemia: Secondary | ICD-10-CM | POA: Diagnosis not present

## 2011-09-26 DIAGNOSIS — I10 Essential (primary) hypertension: Secondary | ICD-10-CM | POA: Diagnosis not present

## 2011-09-26 DIAGNOSIS — I2510 Atherosclerotic heart disease of native coronary artery without angina pectoris: Secondary | ICD-10-CM | POA: Diagnosis not present

## 2011-10-11 DIAGNOSIS — Z7901 Long term (current) use of anticoagulants: Secondary | ICD-10-CM | POA: Diagnosis not present

## 2011-10-11 DIAGNOSIS — I4891 Unspecified atrial fibrillation: Secondary | ICD-10-CM | POA: Diagnosis not present

## 2011-11-08 DIAGNOSIS — Z7901 Long term (current) use of anticoagulants: Secondary | ICD-10-CM | POA: Diagnosis not present

## 2011-11-08 DIAGNOSIS — I4891 Unspecified atrial fibrillation: Secondary | ICD-10-CM | POA: Diagnosis not present

## 2011-11-20 DIAGNOSIS — I4891 Unspecified atrial fibrillation: Secondary | ICD-10-CM | POA: Diagnosis not present

## 2011-11-20 DIAGNOSIS — I10 Essential (primary) hypertension: Secondary | ICD-10-CM | POA: Diagnosis not present

## 2011-11-20 DIAGNOSIS — I2510 Atherosclerotic heart disease of native coronary artery without angina pectoris: Secondary | ICD-10-CM | POA: Diagnosis not present

## 2011-11-20 DIAGNOSIS — E782 Mixed hyperlipidemia: Secondary | ICD-10-CM | POA: Diagnosis not present

## 2011-12-10 DIAGNOSIS — I4891 Unspecified atrial fibrillation: Secondary | ICD-10-CM | POA: Diagnosis not present

## 2011-12-10 DIAGNOSIS — Z7901 Long term (current) use of anticoagulants: Secondary | ICD-10-CM | POA: Diagnosis not present

## 2011-12-10 DIAGNOSIS — I10 Essential (primary) hypertension: Secondary | ICD-10-CM | POA: Diagnosis not present

## 2012-01-17 DIAGNOSIS — Z7901 Long term (current) use of anticoagulants: Secondary | ICD-10-CM | POA: Diagnosis not present

## 2012-01-17 DIAGNOSIS — I4891 Unspecified atrial fibrillation: Secondary | ICD-10-CM | POA: Diagnosis not present

## 2012-01-17 DIAGNOSIS — I10 Essential (primary) hypertension: Secondary | ICD-10-CM | POA: Diagnosis not present

## 2012-02-18 DIAGNOSIS — I4891 Unspecified atrial fibrillation: Secondary | ICD-10-CM | POA: Diagnosis not present

## 2012-02-18 DIAGNOSIS — Z7901 Long term (current) use of anticoagulants: Secondary | ICD-10-CM | POA: Diagnosis not present

## 2012-02-18 DIAGNOSIS — I10 Essential (primary) hypertension: Secondary | ICD-10-CM | POA: Diagnosis not present

## 2012-03-03 DIAGNOSIS — I10 Essential (primary) hypertension: Secondary | ICD-10-CM | POA: Diagnosis not present

## 2012-03-03 DIAGNOSIS — Z7901 Long term (current) use of anticoagulants: Secondary | ICD-10-CM | POA: Diagnosis not present

## 2012-03-03 DIAGNOSIS — I4891 Unspecified atrial fibrillation: Secondary | ICD-10-CM | POA: Diagnosis not present

## 2012-03-17 DIAGNOSIS — I10 Essential (primary) hypertension: Secondary | ICD-10-CM | POA: Diagnosis not present

## 2012-03-17 DIAGNOSIS — Z7901 Long term (current) use of anticoagulants: Secondary | ICD-10-CM | POA: Diagnosis not present

## 2012-03-17 DIAGNOSIS — I2510 Atherosclerotic heart disease of native coronary artery without angina pectoris: Secondary | ICD-10-CM | POA: Diagnosis not present

## 2012-03-17 DIAGNOSIS — I4891 Unspecified atrial fibrillation: Secondary | ICD-10-CM | POA: Diagnosis not present

## 2012-03-26 DIAGNOSIS — I10 Essential (primary) hypertension: Secondary | ICD-10-CM | POA: Diagnosis not present

## 2012-03-26 DIAGNOSIS — E1129 Type 2 diabetes mellitus with other diabetic kidney complication: Secondary | ICD-10-CM | POA: Diagnosis not present

## 2012-03-26 DIAGNOSIS — E119 Type 2 diabetes mellitus without complications: Secondary | ICD-10-CM | POA: Diagnosis not present

## 2012-04-02 DIAGNOSIS — E782 Mixed hyperlipidemia: Secondary | ICD-10-CM | POA: Diagnosis not present

## 2012-04-02 DIAGNOSIS — E119 Type 2 diabetes mellitus without complications: Secondary | ICD-10-CM | POA: Diagnosis not present

## 2012-04-02 DIAGNOSIS — I10 Essential (primary) hypertension: Secondary | ICD-10-CM | POA: Diagnosis not present

## 2012-04-17 DIAGNOSIS — I4891 Unspecified atrial fibrillation: Secondary | ICD-10-CM | POA: Diagnosis not present

## 2012-04-17 DIAGNOSIS — I10 Essential (primary) hypertension: Secondary | ICD-10-CM | POA: Diagnosis not present

## 2012-04-17 DIAGNOSIS — I2510 Atherosclerotic heart disease of native coronary artery without angina pectoris: Secondary | ICD-10-CM | POA: Diagnosis not present

## 2012-04-17 DIAGNOSIS — Z7901 Long term (current) use of anticoagulants: Secondary | ICD-10-CM | POA: Diagnosis not present

## 2012-05-13 DIAGNOSIS — I4891 Unspecified atrial fibrillation: Secondary | ICD-10-CM | POA: Diagnosis not present

## 2012-05-13 DIAGNOSIS — I2510 Atherosclerotic heart disease of native coronary artery without angina pectoris: Secondary | ICD-10-CM | POA: Diagnosis not present

## 2012-05-13 DIAGNOSIS — I10 Essential (primary) hypertension: Secondary | ICD-10-CM | POA: Diagnosis not present

## 2012-05-13 DIAGNOSIS — Z7901 Long term (current) use of anticoagulants: Secondary | ICD-10-CM | POA: Diagnosis not present

## 2012-06-05 DIAGNOSIS — E782 Mixed hyperlipidemia: Secondary | ICD-10-CM | POA: Diagnosis not present

## 2012-06-05 DIAGNOSIS — I10 Essential (primary) hypertension: Secondary | ICD-10-CM | POA: Diagnosis not present

## 2012-06-05 DIAGNOSIS — I2510 Atherosclerotic heart disease of native coronary artery without angina pectoris: Secondary | ICD-10-CM | POA: Diagnosis not present

## 2012-06-16 DIAGNOSIS — Z7901 Long term (current) use of anticoagulants: Secondary | ICD-10-CM | POA: Diagnosis not present

## 2012-06-16 DIAGNOSIS — I10 Essential (primary) hypertension: Secondary | ICD-10-CM | POA: Diagnosis not present

## 2012-06-16 DIAGNOSIS — I4891 Unspecified atrial fibrillation: Secondary | ICD-10-CM | POA: Diagnosis not present

## 2012-06-16 DIAGNOSIS — Z23 Encounter for immunization: Secondary | ICD-10-CM | POA: Diagnosis not present

## 2012-06-16 DIAGNOSIS — I2510 Atherosclerotic heart disease of native coronary artery without angina pectoris: Secondary | ICD-10-CM | POA: Diagnosis not present

## 2012-06-30 DIAGNOSIS — Z7901 Long term (current) use of anticoagulants: Secondary | ICD-10-CM | POA: Diagnosis not present

## 2012-06-30 DIAGNOSIS — I4891 Unspecified atrial fibrillation: Secondary | ICD-10-CM | POA: Diagnosis not present

## 2012-06-30 DIAGNOSIS — I2510 Atherosclerotic heart disease of native coronary artery without angina pectoris: Secondary | ICD-10-CM | POA: Diagnosis not present

## 2012-06-30 DIAGNOSIS — I10 Essential (primary) hypertension: Secondary | ICD-10-CM | POA: Diagnosis not present

## 2012-07-14 DIAGNOSIS — I10 Essential (primary) hypertension: Secondary | ICD-10-CM | POA: Diagnosis not present

## 2012-07-14 DIAGNOSIS — Z7901 Long term (current) use of anticoagulants: Secondary | ICD-10-CM | POA: Diagnosis not present

## 2012-07-14 DIAGNOSIS — I4891 Unspecified atrial fibrillation: Secondary | ICD-10-CM | POA: Diagnosis not present

## 2012-07-15 ENCOUNTER — Ambulatory Visit (INDEPENDENT_AMBULATORY_CARE_PROVIDER_SITE_OTHER): Payer: Medicare Other | Admitting: Family Medicine

## 2012-07-15 ENCOUNTER — Ambulatory Visit: Payer: Medicare Other

## 2012-07-15 VITALS — BP 128/70 | HR 95 | Temp 97.3°F | Resp 16 | Ht 70.0 in | Wt 171.0 lb

## 2012-07-15 DIAGNOSIS — R2230 Localized swelling, mass and lump, unspecified upper limb: Secondary | ICD-10-CM

## 2012-07-15 DIAGNOSIS — R229 Localized swelling, mass and lump, unspecified: Secondary | ICD-10-CM

## 2012-07-15 NOTE — Progress Notes (Signed)
[facility]:  Office Visit  Chief Complaint:  Chief Complaint  Patient presents with  . Mass    abnormal mass on right forearm x today    HPI: Craig Richard is a 78 y.o. male who complains of  mass on right upper forearm at 2:30 today while in the shower. He denies any pain, numbness or tingling with this. No prior injuries. No skin cancer, tumors. Denies night sweats, fevers, chills, unintentional weight loss.  Past Medical History  Diagnosis Date  . Atrial fibrillation     ?6 yrs  . Hyperlipidemia   . Hypertension    Past Surgical History  Procedure Date  . Eye surgery    History   Social History  . Marital Status: Widowed    Spouse Name: N/A    Number of Children: N/A  . Years of Education: N/A   Social History Main Topics  . Smoking status: Never Smoker   . Smokeless tobacco: Current User    Types: Chew  . Alcohol Use: No  . Drug Use: None  . Sexually Active: None   Other Topics Concern  . None   Social History Narrative  . None   No family history on file. No Known Allergies Prior to Admission medications   Medication Sig Start Date End Date Taking? Authorizing Provider  aspirin 81 MG tablet Take 81 mg by mouth daily.   Yes Historical Provider, MD  Atorvastatin Calcium (LIPITOR PO) Take by mouth.   Yes Historical Provider, MD  Ramipril (ALTACE PO) Take by mouth.   Yes Historical Provider, MD  warfarin (COUMADIN) 5 MG tablet Take 5 mg by mouth daily.   Yes Historical Provider, MD     ROS: The patient denies fevers, chills, night sweats, unintentional weight loss, chest pain, palpitations, wheezing, dyspnea on exertion, nausea, vomiting, abdominal pain, dysuria, hematuria, melena, numbness, weakness, or tingling.  All other systems have been reviewed and were otherwise negative with the exception of those mentioned in the HPI and as above.    PHYSICAL EXAM: Filed Vitals:   07/15/12 1553  BP: 128/70  Pulse: 95  Temp: 97.3 F  (36.3 C)  Resp: 16   Filed Vitals:   07/15/12 1553  Height: 5\' 10"  (1.778 m)  Weight: 171 lb (77.565 kg)   Body mass index is 24.54 kg/(m^2).  General: Alert, no acute distress HEENT:  Normocephalic, atraumatic, oropharynx patent.  Cardiovascular:  irregular rate and rhythm, no rubs murmurs or gallops.  No Carotid bruits, radial pulse intact. No pedal edema.  Respiratory: Clear to auscultation bilaterally.  No wheezes, rales, or rhonchi.  No cyanosis, no use of accessory musculature GI: No organomegaly, abdomen is soft and non-tender, positive bowel sounds.  No masses. Skin: + 1 x 1 inch moderately hard palpable mass that is fixated into muscle, nontender, no fluid, no erythema, warmth Neurologic: Facial musculature symmetric. Psychiatric: Patient is appropriate throughout our interaction. Lymphatic: No cervical lymphadenopathy Musculoskeletal: Gait intact.   LABS: Results for orders placed during the hospital encounter of 10/07/08  GLUCOSE, CAPILLARY      Component Value Range   Glucose-Capillary 76  70 - 99 mg/dL  GLUCOSE, CAPILLARY      Component Value Range   Glucose-Capillary 135 (*) 70 - 99 mg/dL  GLUCOSE, CAPILLARY      Component Value Range   Glucose-Capillary 148 (*) 70 - 99 mg/dL  GLUCOSE, CAPILLARY      Component Value Range   Glucose-Capillary 128 (*)  70 - 99 mg/dL  GLUCOSE, CAPILLARY      Component Value Range   Glucose-Capillary 135 (*) 70 - 99 mg/dL  GLUCOSE, CAPILLARY      Component Value Range   Glucose-Capillary 90  70 - 99 mg/dL  GLUCOSE, CAPILLARY      Component Value Range   Glucose-Capillary 134 (*) 70 - 99 mg/dL  GLUCOSE, CAPILLARY      Component Value Range   Glucose-Capillary 105 (*) 70 - 99 mg/dL  GLUCOSE, CAPILLARY      Component Value Range   Glucose-Capillary 108 (*) 70 - 99 mg/dL  GLUCOSE, CAPILLARY      Component Value Range   Glucose-Capillary 83  70 - 99 mg/dL     EKG/XRAY:   Primary read interpreted by Dr. Conley Rolls at [facility]. No  bony fractures, dislocations, abnormalities. + soft tissue swelling  ASSESSMENT/PLAN: Encounter Diagnosis  Name Primary?  . Arm mass Yes   ? Etiology soft tissue mass benign vs malignant. Unlikely cyst, ganglion or lipoma in origin. The mass is palpable, nontender and is fixated to muscle. I am just surprised the patient just noticed it today. He states he did not notice it 2 days ago.  Will ask patient to give me an update in 1 week. If he still has this and not getting smaller then I will get a soft tissue MRI without contrast.     Aidenn Skellenger PHUONG, DO 07/15/2012 4:43 PM

## 2012-08-11 DIAGNOSIS — Z7901 Long term (current) use of anticoagulants: Secondary | ICD-10-CM | POA: Diagnosis not present

## 2012-08-11 DIAGNOSIS — I4891 Unspecified atrial fibrillation: Secondary | ICD-10-CM | POA: Diagnosis not present

## 2012-08-11 DIAGNOSIS — I10 Essential (primary) hypertension: Secondary | ICD-10-CM | POA: Diagnosis not present

## 2012-08-11 DIAGNOSIS — I2510 Atherosclerotic heart disease of native coronary artery without angina pectoris: Secondary | ICD-10-CM | POA: Diagnosis not present

## 2012-08-29 DIAGNOSIS — H524 Presbyopia: Secondary | ICD-10-CM | POA: Diagnosis not present

## 2012-08-29 DIAGNOSIS — E119 Type 2 diabetes mellitus without complications: Secondary | ICD-10-CM | POA: Diagnosis not present

## 2012-08-29 DIAGNOSIS — Z961 Presence of intraocular lens: Secondary | ICD-10-CM | POA: Diagnosis not present

## 2012-08-29 DIAGNOSIS — H52209 Unspecified astigmatism, unspecified eye: Secondary | ICD-10-CM | POA: Diagnosis not present

## 2012-09-11 DIAGNOSIS — I10 Essential (primary) hypertension: Secondary | ICD-10-CM | POA: Diagnosis not present

## 2012-09-11 DIAGNOSIS — Z7901 Long term (current) use of anticoagulants: Secondary | ICD-10-CM | POA: Diagnosis not present

## 2012-09-11 DIAGNOSIS — I2510 Atherosclerotic heart disease of native coronary artery without angina pectoris: Secondary | ICD-10-CM | POA: Diagnosis not present

## 2012-09-11 DIAGNOSIS — I4891 Unspecified atrial fibrillation: Secondary | ICD-10-CM | POA: Diagnosis not present

## 2012-10-01 DIAGNOSIS — I10 Essential (primary) hypertension: Secondary | ICD-10-CM | POA: Diagnosis not present

## 2012-10-01 DIAGNOSIS — Z125 Encounter for screening for malignant neoplasm of prostate: Secondary | ICD-10-CM | POA: Diagnosis not present

## 2012-10-01 DIAGNOSIS — E782 Mixed hyperlipidemia: Secondary | ICD-10-CM | POA: Diagnosis not present

## 2012-10-01 DIAGNOSIS — E119 Type 2 diabetes mellitus without complications: Secondary | ICD-10-CM | POA: Diagnosis not present

## 2012-10-08 DIAGNOSIS — E782 Mixed hyperlipidemia: Secondary | ICD-10-CM | POA: Diagnosis not present

## 2012-10-08 DIAGNOSIS — E119 Type 2 diabetes mellitus without complications: Secondary | ICD-10-CM | POA: Diagnosis not present

## 2012-10-08 DIAGNOSIS — D649 Anemia, unspecified: Secondary | ICD-10-CM | POA: Diagnosis not present

## 2012-10-08 DIAGNOSIS — E538 Deficiency of other specified B group vitamins: Secondary | ICD-10-CM | POA: Diagnosis not present

## 2012-10-08 DIAGNOSIS — I4891 Unspecified atrial fibrillation: Secondary | ICD-10-CM | POA: Diagnosis not present

## 2012-10-08 DIAGNOSIS — Z1212 Encounter for screening for malignant neoplasm of rectum: Secondary | ICD-10-CM | POA: Diagnosis not present

## 2012-10-08 DIAGNOSIS — I10 Essential (primary) hypertension: Secondary | ICD-10-CM | POA: Diagnosis not present

## 2012-10-09 DIAGNOSIS — I10 Essential (primary) hypertension: Secondary | ICD-10-CM | POA: Diagnosis not present

## 2012-10-09 DIAGNOSIS — I4891 Unspecified atrial fibrillation: Secondary | ICD-10-CM | POA: Diagnosis not present

## 2012-10-09 DIAGNOSIS — Z7901 Long term (current) use of anticoagulants: Secondary | ICD-10-CM | POA: Diagnosis not present

## 2012-10-15 DIAGNOSIS — D509 Iron deficiency anemia, unspecified: Secondary | ICD-10-CM | POA: Diagnosis not present

## 2012-10-15 DIAGNOSIS — I4891 Unspecified atrial fibrillation: Secondary | ICD-10-CM | POA: Diagnosis not present

## 2012-10-15 DIAGNOSIS — E538 Deficiency of other specified B group vitamins: Secondary | ICD-10-CM | POA: Diagnosis not present

## 2012-10-16 DIAGNOSIS — K921 Melena: Secondary | ICD-10-CM | POA: Diagnosis not present

## 2012-10-16 DIAGNOSIS — D509 Iron deficiency anemia, unspecified: Secondary | ICD-10-CM | POA: Diagnosis not present

## 2012-10-23 DIAGNOSIS — I4891 Unspecified atrial fibrillation: Secondary | ICD-10-CM | POA: Diagnosis not present

## 2012-10-23 DIAGNOSIS — Z7901 Long term (current) use of anticoagulants: Secondary | ICD-10-CM | POA: Diagnosis not present

## 2012-10-23 DIAGNOSIS — I2510 Atherosclerotic heart disease of native coronary artery without angina pectoris: Secondary | ICD-10-CM | POA: Diagnosis not present

## 2012-10-23 DIAGNOSIS — I10 Essential (primary) hypertension: Secondary | ICD-10-CM | POA: Diagnosis not present

## 2012-10-24 ENCOUNTER — Encounter (HOSPITAL_COMMUNITY): Payer: Self-pay | Admitting: Pharmacy Technician

## 2012-10-29 ENCOUNTER — Encounter (HOSPITAL_COMMUNITY): Payer: Self-pay | Admitting: *Deleted

## 2012-10-29 ENCOUNTER — Encounter (HOSPITAL_COMMUNITY)
Admission: RE | Disposition: A | Discharge status: Home / Self Care | Payer: Self-pay | Source: Ambulatory Visit | Attending: Gastroenterology

## 2012-10-29 ENCOUNTER — Ambulatory Visit (HOSPITAL_COMMUNITY)
Admission: RE | Admit: 2012-10-29 | Discharge: 2012-10-29 | Disposition: A | Discharge status: Home / Self Care | Payer: Medicare Other | Source: Ambulatory Visit | Attending: Gastroenterology | Admitting: Gastroenterology

## 2012-10-29 DIAGNOSIS — I2510 Atherosclerotic heart disease of native coronary artery without angina pectoris: Secondary | ICD-10-CM | POA: Diagnosis not present

## 2012-10-29 DIAGNOSIS — I10 Essential (primary) hypertension: Secondary | ICD-10-CM | POA: Insufficient documentation

## 2012-10-29 DIAGNOSIS — Z79899 Other long term (current) drug therapy: Secondary | ICD-10-CM | POA: Diagnosis not present

## 2012-10-29 DIAGNOSIS — K31811 Angiodysplasia of stomach and duodenum with bleeding: Secondary | ICD-10-CM | POA: Insufficient documentation

## 2012-10-29 DIAGNOSIS — K31819 Angiodysplasia of stomach and duodenum without bleeding: Secondary | ICD-10-CM | POA: Insufficient documentation

## 2012-10-29 DIAGNOSIS — K921 Melena: Secondary | ICD-10-CM | POA: Diagnosis not present

## 2012-10-29 DIAGNOSIS — D509 Iron deficiency anemia, unspecified: Secondary | ICD-10-CM | POA: Insufficient documentation

## 2012-10-29 DIAGNOSIS — E119 Type 2 diabetes mellitus without complications: Secondary | ICD-10-CM | POA: Diagnosis not present

## 2012-10-29 DIAGNOSIS — E785 Hyperlipidemia, unspecified: Secondary | ICD-10-CM | POA: Diagnosis not present

## 2012-10-29 DIAGNOSIS — Z7901 Long term (current) use of anticoagulants: Secondary | ICD-10-CM | POA: Diagnosis not present

## 2012-10-29 HISTORY — PX: HOT HEMOSTASIS: SHX5433

## 2012-10-29 HISTORY — PX: ESOPHAGOGASTRODUODENOSCOPY: SHX5428

## 2012-10-29 LAB — GLUCOSE, CAPILLARY: Glucose-Capillary: 77 mg/dL (ref 70–99)

## 2012-10-29 SURGERY — EGD (ESOPHAGOGASTRODUODENOSCOPY)
Anesthesia: Moderate Sedation

## 2012-10-29 MED ORDER — FENTANYL CITRATE 0.05 MG/ML IJ SOLN
INTRAMUSCULAR | Status: AC
  Filled 2012-10-29: qty 2

## 2012-10-29 MED ORDER — FENTANYL CITRATE 0.05 MG/ML IJ SOLN
INTRAMUSCULAR | Status: DC | PRN
  Administered 2012-10-29: 25 ug via INTRAVENOUS

## 2012-10-29 MED ORDER — MIDAZOLAM HCL 10 MG/2ML IJ SOLN
INTRAMUSCULAR | Status: DC | PRN
  Administered 2012-10-29: 2 mg via INTRAVENOUS
  Administered 2012-10-29: 1 mg via INTRAVENOUS

## 2012-10-29 MED ORDER — MIDAZOLAM HCL 10 MG/2ML IJ SOLN
INTRAMUSCULAR | Status: AC
  Filled 2012-10-29: qty 2

## 2012-10-29 MED ORDER — WARFARIN SODIUM 5 MG PO TABS: 2.5000 mg | ORAL_TABLET | Freq: Every day | ORAL | Status: DC

## 2012-10-29 MED ORDER — BUTAMBEN-TETRACAINE-BENZOCAINE 2-2-14 % EX AERO
INHALATION_SPRAY | CUTANEOUS | Status: DC | PRN
  Administered 2012-10-29: 2 via TOPICAL

## 2012-10-29 NOTE — Op Note (Signed)
[facility] [address] [place], [address]   ENDOSCOPY PROCEDURE REPORT  PATIENT: Craig, Richard  MR#: [id number] BIRTHDATE: Sep 09, 1921 , 89  yrs. old GENDER: Male ENDOSCOPIST: Willis Modena, MD REFERRED BY:  Georgianne Fick, M.D. PROCEDURE DATE:  10/29/2012 PROCEDURE:  EGD w/ control of bleeding and EGD w/ ablation ASA CLASS:     Class III INDICATIONS:  melena, anemia. MEDICATIONS: Fentanyl 50 mcg IV and Versed 3 mg IV TOPICAL ANESTHETIC: Cetacaine Spray DESCRIPTION OF PROCEDURE: After the risks benefits and alternatives of the procedure were thoroughly explained, informed consent was obtained.  The Pentax EG-[id number] peds [id number] endoscope was introduced through the mouth and advanced to the third portion of the duodenum. Without limitations.  The instrument was slowly withdrawn as the mucosa was fully examined.   Findings:  Possible short-segment Barrett's mucosa, without overt mass lesion or ulceration, not biopsied given age and comorbidities.  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 Otherwise normal stomach and pylorus.  Few medium-sized non-bleeding AVMs in the proximal duodenum, otherwise normal duodenum.  After completion of our diagnostic exam, the gastric and duodenal AVMs were obliterated with APC (40W, 0.5 L) with good effect.  Retroflexed view into cardia was normal.          The scope was then withdrawn from the patient and the procedure completed.  ENDOSCOPIC IMPRESSION:     As above.  Suspect AVMs, in setting of chronic anticoagulation, are likely at least contributing to patient's melena and anemia.  RECOMMENDATIONS:     1.  Watch for potential complications of procedure. 2.  Restart warfarin tomorrow;  would NOT resume Lovenox. 4.  Follow clinical response to APC; if melena and anemia improve, would follow expectantly; if symptoms persist, consider capsule endoscopy and, possibly enteroscopy (pending capsule findings)  as next step in management. 4.  Follow-up with [facility] in 3-4 weeks. eSigned:  Willis Modena, MD 10/29/2012 11:39 AM  CC:

## 2012-10-29 NOTE — H&P (Signed)
Patient interval history reviewed.  Patient examined again.  There has been no change from documented H/P dated 10/16/12 (scanned into chart from our office) except as documented above.  Assessment:  1.  Anemia. 2.  Melena.  Plan:  1.  Endoscopy with possible hemostatic therapy. 2.  Risks (bleeding, infection, bowel perforation that could require surgery, sedation-related changes in cardiopulmonary systems), benefits (identification and possible treatment of source of symptoms, exclusion of certain causes of symptoms), and alternatives (watchful waiting, radiographic imaging studies, empiric medical treatment) of upper endoscopy with possible hemostatic (EGD) were explained to patient in detail and he wishes to proceed.

## 2012-10-30 ENCOUNTER — Encounter (HOSPITAL_COMMUNITY): Payer: Self-pay | Admitting: Gastroenterology

## 2012-11-04 DIAGNOSIS — D509 Iron deficiency anemia, unspecified: Secondary | ICD-10-CM | POA: Diagnosis not present

## 2012-11-04 DIAGNOSIS — I10 Essential (primary) hypertension: Secondary | ICD-10-CM | POA: Diagnosis not present

## 2012-11-04 DIAGNOSIS — Z7901 Long term (current) use of anticoagulants: Secondary | ICD-10-CM | POA: Diagnosis not present

## 2012-11-04 DIAGNOSIS — I4891 Unspecified atrial fibrillation: Secondary | ICD-10-CM | POA: Diagnosis not present

## 2012-11-18 DIAGNOSIS — Z7901 Long term (current) use of anticoagulants: Secondary | ICD-10-CM | POA: Diagnosis not present

## 2012-11-18 DIAGNOSIS — I4891 Unspecified atrial fibrillation: Secondary | ICD-10-CM | POA: Diagnosis not present

## 2012-11-18 DIAGNOSIS — D509 Iron deficiency anemia, unspecified: Secondary | ICD-10-CM | POA: Diagnosis not present

## 2012-11-18 DIAGNOSIS — I10 Essential (primary) hypertension: Secondary | ICD-10-CM | POA: Diagnosis not present

## 2012-11-18 DIAGNOSIS — I2510 Atherosclerotic heart disease of native coronary artery without angina pectoris: Secondary | ICD-10-CM | POA: Diagnosis not present

## 2012-11-24 DIAGNOSIS — K31811 Angiodysplasia of stomach and duodenum with bleeding: Secondary | ICD-10-CM | POA: Diagnosis not present

## 2012-11-26 DIAGNOSIS — I2510 Atherosclerotic heart disease of native coronary artery without angina pectoris: Secondary | ICD-10-CM | POA: Diagnosis not present

## 2012-11-26 DIAGNOSIS — D509 Iron deficiency anemia, unspecified: Secondary | ICD-10-CM | POA: Diagnosis not present

## 2012-11-26 DIAGNOSIS — I10 Essential (primary) hypertension: Secondary | ICD-10-CM | POA: Diagnosis not present

## 2012-11-26 DIAGNOSIS — D649 Anemia, unspecified: Secondary | ICD-10-CM | POA: Diagnosis not present

## 2012-12-02 DIAGNOSIS — I10 Essential (primary) hypertension: Secondary | ICD-10-CM | POA: Diagnosis not present

## 2012-12-02 DIAGNOSIS — Z7901 Long term (current) use of anticoagulants: Secondary | ICD-10-CM | POA: Diagnosis not present

## 2012-12-02 DIAGNOSIS — I4891 Unspecified atrial fibrillation: Secondary | ICD-10-CM | POA: Diagnosis not present

## 2012-12-10 DIAGNOSIS — I2510 Atherosclerotic heart disease of native coronary artery without angina pectoris: Secondary | ICD-10-CM | POA: Diagnosis not present

## 2012-12-10 DIAGNOSIS — I4891 Unspecified atrial fibrillation: Secondary | ICD-10-CM | POA: Diagnosis not present

## 2012-12-10 DIAGNOSIS — E782 Mixed hyperlipidemia: Secondary | ICD-10-CM | POA: Diagnosis not present

## 2012-12-16 DIAGNOSIS — I4891 Unspecified atrial fibrillation: Secondary | ICD-10-CM | POA: Diagnosis not present

## 2012-12-16 DIAGNOSIS — Z7901 Long term (current) use of anticoagulants: Secondary | ICD-10-CM | POA: Diagnosis not present

## 2012-12-29 DIAGNOSIS — Z7901 Long term (current) use of anticoagulants: Secondary | ICD-10-CM | POA: Diagnosis not present

## 2012-12-30 DIAGNOSIS — Z7901 Long term (current) use of anticoagulants: Secondary | ICD-10-CM | POA: Diagnosis not present

## 2012-12-30 DIAGNOSIS — I10 Essential (primary) hypertension: Secondary | ICD-10-CM | POA: Diagnosis not present

## 2012-12-30 DIAGNOSIS — I4891 Unspecified atrial fibrillation: Secondary | ICD-10-CM | POA: Diagnosis not present

## 2012-12-30 DIAGNOSIS — I2510 Atherosclerotic heart disease of native coronary artery without angina pectoris: Secondary | ICD-10-CM | POA: Diagnosis not present

## 2013-01-27 DIAGNOSIS — I4891 Unspecified atrial fibrillation: Secondary | ICD-10-CM | POA: Diagnosis not present

## 2013-01-27 DIAGNOSIS — Z7901 Long term (current) use of anticoagulants: Secondary | ICD-10-CM | POA: Diagnosis not present

## 2013-03-02 DIAGNOSIS — Z7901 Long term (current) use of anticoagulants: Secondary | ICD-10-CM | POA: Diagnosis not present

## 2013-03-02 DIAGNOSIS — I4891 Unspecified atrial fibrillation: Secondary | ICD-10-CM | POA: Diagnosis not present

## 2013-03-17 DIAGNOSIS — Z7901 Long term (current) use of anticoagulants: Secondary | ICD-10-CM | POA: Diagnosis not present

## 2013-03-17 DIAGNOSIS — I4891 Unspecified atrial fibrillation: Secondary | ICD-10-CM | POA: Diagnosis not present

## 2013-04-15 DIAGNOSIS — I4891 Unspecified atrial fibrillation: Secondary | ICD-10-CM | POA: Diagnosis not present

## 2013-04-15 DIAGNOSIS — Z7901 Long term (current) use of anticoagulants: Secondary | ICD-10-CM | POA: Diagnosis not present

## 2013-04-28 DIAGNOSIS — I10 Essential (primary) hypertension: Secondary | ICD-10-CM | POA: Diagnosis not present

## 2013-04-28 DIAGNOSIS — D649 Anemia, unspecified: Secondary | ICD-10-CM | POA: Diagnosis not present

## 2013-04-28 DIAGNOSIS — E119 Type 2 diabetes mellitus without complications: Secondary | ICD-10-CM | POA: Diagnosis not present

## 2013-04-28 DIAGNOSIS — D509 Iron deficiency anemia, unspecified: Secondary | ICD-10-CM | POA: Diagnosis not present

## 2013-05-06 DIAGNOSIS — E119 Type 2 diabetes mellitus without complications: Secondary | ICD-10-CM | POA: Diagnosis not present

## 2013-05-06 DIAGNOSIS — I10 Essential (primary) hypertension: Secondary | ICD-10-CM | POA: Diagnosis not present

## 2013-05-06 DIAGNOSIS — D509 Iron deficiency anemia, unspecified: Secondary | ICD-10-CM | POA: Diagnosis not present

## 2013-05-06 DIAGNOSIS — E782 Mixed hyperlipidemia: Secondary | ICD-10-CM | POA: Diagnosis not present

## 2013-05-14 DIAGNOSIS — I4891 Unspecified atrial fibrillation: Secondary | ICD-10-CM | POA: Diagnosis not present

## 2013-05-14 DIAGNOSIS — D509 Iron deficiency anemia, unspecified: Secondary | ICD-10-CM | POA: Diagnosis not present

## 2013-05-14 DIAGNOSIS — Z23 Encounter for immunization: Secondary | ICD-10-CM | POA: Diagnosis not present

## 2013-05-14 DIAGNOSIS — Z1212 Encounter for screening for malignant neoplasm of rectum: Secondary | ICD-10-CM | POA: Diagnosis not present

## 2013-05-14 DIAGNOSIS — Z7901 Long term (current) use of anticoagulants: Secondary | ICD-10-CM | POA: Diagnosis not present

## 2013-05-27 DIAGNOSIS — D509 Iron deficiency anemia, unspecified: Secondary | ICD-10-CM | POA: Diagnosis not present

## 2013-06-03 DIAGNOSIS — E119 Type 2 diabetes mellitus without complications: Secondary | ICD-10-CM | POA: Diagnosis not present

## 2013-06-03 DIAGNOSIS — Z006 Encounter for examination for normal comparison and control in clinical research program: Secondary | ICD-10-CM | POA: Diagnosis not present

## 2013-06-03 DIAGNOSIS — I10 Essential (primary) hypertension: Secondary | ICD-10-CM | POA: Diagnosis not present

## 2013-06-03 DIAGNOSIS — D509 Iron deficiency anemia, unspecified: Secondary | ICD-10-CM | POA: Diagnosis not present

## 2013-06-03 DIAGNOSIS — E782 Mixed hyperlipidemia: Secondary | ICD-10-CM | POA: Diagnosis not present

## 2013-06-05 ENCOUNTER — Encounter: Payer: Self-pay | Admitting: *Deleted

## 2013-06-09 ENCOUNTER — Encounter: Payer: Self-pay | Admitting: Cardiology

## 2013-06-09 ENCOUNTER — Ambulatory Visit: Payer: Medicare Other | Admitting: Cardiology

## 2013-06-09 ENCOUNTER — Ambulatory Visit (INDEPENDENT_AMBULATORY_CARE_PROVIDER_SITE_OTHER): Payer: Medicare Other | Admitting: Cardiology

## 2013-06-09 VITALS — BP 122/78 | HR 68 | Ht 71.0 in | Wt 164.9 lb

## 2013-06-09 DIAGNOSIS — I2510 Atherosclerotic heart disease of native coronary artery without angina pectoris: Secondary | ICD-10-CM

## 2013-06-09 DIAGNOSIS — I4891 Unspecified atrial fibrillation: Secondary | ICD-10-CM | POA: Diagnosis not present

## 2013-06-09 DIAGNOSIS — I10 Essential (primary) hypertension: Secondary | ICD-10-CM | POA: Insufficient documentation

## 2013-06-09 DIAGNOSIS — E785 Hyperlipidemia, unspecified: Secondary | ICD-10-CM | POA: Diagnosis not present

## 2013-06-09 NOTE — Assessment & Plan Note (Signed)
Permanent Atrial fib. On coumadin followed by [facility].

## 2013-06-09 NOTE — Patient Instructions (Signed)
Call if you have any chest pain or shortness of breath.  Your labs for cholesterol were excellent.  Follow up with Dr. Allyson Sabal in 6 months unless you have problems before that time.

## 2013-06-09 NOTE — Assessment & Plan Note (Signed)
Last labs excellent

## 2013-06-09 NOTE — Assessment & Plan Note (Signed)
No chest pain, no shortness of breath.

## 2013-06-09 NOTE — Assessment & Plan Note (Signed)
controlled 

## 2013-06-09 NOTE — Progress Notes (Signed)
06/09/2013   PCP: Georgianne Fick, MD   Chief Complaint  Patient presents with  . 6 month visit    no chest pain , no sob, no edema     Primary Cardiologist: Dr. Allyson Sabal  HPI:  79 year-old white male followed by Dr. Gery Pray in previous patient of Dr. Aleen Campi for hypertension and hyperlipidemia with chronic/permanent atrial fibrillation rate controlled and is on Coumadin for anticoagulation. Coumadin is followed by [facility] increase her medical. In 2002 and had acute anterior wall MI Atteberry angioplasty of his LAD lesion EF at that time was 40%. Followup stress test revealed no ischemia and small apical scar. His cholesterol has been stable and is here today for followup.  Recently he developed acute anemia and is being worked up by his primary care physician. Most recent hemoglobin October 8 was 10.3. Other labs have been stable creatinine 1.3. Most recent cholesterol total cholesterol 97 tickles her 32 HDL 49 LDL 42.   He has no chest pain, no shortness of breath, no complaints at all.  No Known Allergies  Current Outpatient Prescriptions  Medication Sig Dispense Refill  . atorvastatin (LIPITOR) 40 MG tablet Take 40 mg by mouth every other day.      . Fe Fum-FePoly-Vit C-Vit B3 (INTEGRA PO) Take 1 capsule by mouth daily.      . isosorbide mononitrate (IMDUR) 60 MG 24 hr tablet Take 60 mg by mouth daily before breakfast.      . metoprolol (LOPRESSOR) 50 MG tablet Take 75 mg by mouth 2 (two) times daily.      . Multiple Vitamin (MULTIVITAMIN) capsule Take 1 capsule by mouth daily.      . nitroGLYCERIN (NITROSTAT) 0.4 MG SL tablet Place 0.4 mg under the tongue every 5 (five) minutes as needed for chest pain.      . ramipril (ALTACE) 5 MG capsule Take 5 mg by mouth daily.      Marland Kitchen warfarin (COUMADIN) 5 MG tablet Take 0.5-1 tablets (2.5-5 mg total) by mouth daily. Takes 1/2 tablet on Sunday Monday Wednesday and Friday and 1 tablet on Tuesday Thursday and Saturday  1  tablet  0   No current facility-administered medications for this visit.    Past Medical History  Diagnosis Date  . Atrial fibrillation     coumadin - follow by [facility]  . Hyperlipidemia   . Hypertension   . History of anterior wall myocardial infarction 09/17/2008  . History of nuclear stress test 06/14/2010    bruce protocol myoview; stress images show perfusion defect in apical wall (scar)  . CAD (coronary artery disease)     Past Surgical History  Procedure Laterality Date  . Eye surgery Bilateral     cataract  . Esophagogastroduodenoscopy N/A 10/29/2012    Procedure: ESOPHAGOGASTRODUODENOSCOPY (EGD);  Surgeon: Willis Modena, MD;  Location: [facility];  Service: Endoscopy;  Laterality: N/A;  . Hot hemostasis N/A 10/29/2012    Procedure: HOT HEMOSTASIS (ARGON PLASMA COAGULATION/BICAP);  Surgeon: Willis Modena, MD;  Location: [facility];  Service: Endoscopy;  Laterality: N/A;  . Coronary angioplasty  09/17/2008    r/t acute anterior wall MI; apical LAD lesion  . Transthoracic echocardiogram  06/29/2010    EF=>55%, normal LV systolic function; LA & RA mod dilated; mild mitral annular calcif & mild MR; mod TR & elevated RV systolic pressure (mild pulm HTN); AV mildly sclerotic; mild pulm valve regurg  . Prostate surgery  2000  . Hemorrhoid surgery  2005    ZOX:WRUEAVW:UJ colds or fevers, no weight changes Skin:no rashes or ulcers HEENT:no blurred vision, no congestion CV:see HPI PUL:see HPI GI:no diarrhea constipation or melena, no indigestion-recent anemia GU:no hematuria, no dysuria MS:no joint pain, no claudication Neuro:no syncope, no lightheadedness Endo:no diabetes, no thyroid disease  PHYSICAL EXAM BP 122/78  Pulse 68  Ht 5\' 11"  (1.803 m)  Wt 164 lb 14.4 oz (74.798 kg)  BMI 23.01 kg/m2 General:Pleasant affect, NAD Skin:Warm and dry, brisk capillary refill HEENT:normocephalic, sclera clear, mucus membranes moist Neck:supple, no JVD, no  bruits, no adenopathy  Heart:irreg irreg without murmur, gallup, rub or click Lungs:clear without rales, rhonchi, or wheezes WJX:BJYN, non tender, + BS, do not palpate liver spleen or masses Ext:no lower ext edema, 2+ pedal pulses, 2+ radial pulses Neuro:alert and oriented, MAE, follows commands, + facial symmetry  WGN:FAOZHY fib-permanent, no acute changes otherwise, rate controlled.  ASSESSMENT AND PLAN CAD in native artery, wth hx ant MI, wth PTCA to LAD, 2010 No chest pain, no shortness of breath.    Hypertension controlled  Atrial fibrillation Permanent Atrial fib. On coumadin followed by [facility].  Hyperlipidemia Last labs excellent  Recent drop in H/H followed by PCP.

## 2013-06-11 ENCOUNTER — Encounter: Payer: Self-pay | Admitting: Cardiology

## 2013-06-11 DIAGNOSIS — Z7901 Long term (current) use of anticoagulants: Secondary | ICD-10-CM | POA: Diagnosis not present

## 2013-06-11 DIAGNOSIS — I4891 Unspecified atrial fibrillation: Secondary | ICD-10-CM | POA: Diagnosis not present

## 2013-06-11 DIAGNOSIS — D509 Iron deficiency anemia, unspecified: Secondary | ICD-10-CM | POA: Diagnosis not present

## 2013-07-14 DIAGNOSIS — I4891 Unspecified atrial fibrillation: Secondary | ICD-10-CM | POA: Diagnosis not present

## 2013-07-14 DIAGNOSIS — Z7901 Long term (current) use of anticoagulants: Secondary | ICD-10-CM | POA: Diagnosis not present

## 2013-08-06 ENCOUNTER — Encounter: Payer: Self-pay | Admitting: Cardiovascular Disease

## 2013-08-11 DIAGNOSIS — I10 Essential (primary) hypertension: Secondary | ICD-10-CM | POA: Diagnosis not present

## 2013-08-11 DIAGNOSIS — Z7901 Long term (current) use of anticoagulants: Secondary | ICD-10-CM | POA: Diagnosis not present

## 2013-08-11 DIAGNOSIS — I4891 Unspecified atrial fibrillation: Secondary | ICD-10-CM | POA: Diagnosis not present

## 2013-09-08 DIAGNOSIS — Z7901 Long term (current) use of anticoagulants: Secondary | ICD-10-CM | POA: Diagnosis not present

## 2013-09-08 DIAGNOSIS — I10 Essential (primary) hypertension: Secondary | ICD-10-CM | POA: Diagnosis not present

## 2013-09-08 DIAGNOSIS — I4891 Unspecified atrial fibrillation: Secondary | ICD-10-CM | POA: Diagnosis not present

## 2013-09-08 DIAGNOSIS — D509 Iron deficiency anemia, unspecified: Secondary | ICD-10-CM | POA: Diagnosis not present

## 2013-10-13 DIAGNOSIS — I10 Essential (primary) hypertension: Secondary | ICD-10-CM | POA: Diagnosis not present

## 2013-10-13 DIAGNOSIS — I4891 Unspecified atrial fibrillation: Secondary | ICD-10-CM | POA: Diagnosis not present

## 2013-10-13 DIAGNOSIS — Z7901 Long term (current) use of anticoagulants: Secondary | ICD-10-CM | POA: Diagnosis not present

## 2013-10-23 ENCOUNTER — Ambulatory Visit (INDEPENDENT_AMBULATORY_CARE_PROVIDER_SITE_OTHER): Payer: Medicare Other | Admitting: Internal Medicine

## 2013-10-23 ENCOUNTER — Ambulatory Visit: Payer: Medicare Other

## 2013-10-23 VITALS — BP 130/84 | HR 86 | Temp 98.3°F | Resp 16 | Ht 71.0 in | Wt 167.0 lb

## 2013-10-23 DIAGNOSIS — R05 Cough: Secondary | ICD-10-CM | POA: Diagnosis not present

## 2013-10-23 DIAGNOSIS — J209 Acute bronchitis, unspecified: Secondary | ICD-10-CM | POA: Diagnosis not present

## 2013-10-23 DIAGNOSIS — R059 Cough, unspecified: Secondary | ICD-10-CM | POA: Diagnosis not present

## 2013-10-23 DIAGNOSIS — J9801 Acute bronchospasm: Secondary | ICD-10-CM

## 2013-10-23 LAB — POCT CBC
GRANULOCYTE PERCENT: 61.2 % (ref 37–80)
HCT, POC: 43.5 % (ref 43.5–53.7)
Hemoglobin: 13.6 g/dL — AB (ref 14.1–18.1)
Lymph, poc: 1.1 (ref 0.6–3.4)
MCH, POC: 31.1 pg (ref 27–31.2)
MCHC: 31.3 g/dL — AB (ref 31.8–35.4)
MCV: 99.3 fL — AB (ref 80–97)
MID (CBC): 0.5 (ref 0–0.9)
MPV: 9.1 fL (ref 0–99.8)
PLATELET COUNT, POC: 170 10*3/uL (ref 142–424)
POC GRANULOCYTE: 2.6 (ref 2–6.9)
POC LYMPH %: 26.1 % (ref 10–50)
POC MID %: 12.7 % — AB (ref 0–12)
RBC: 4.38 M/uL — AB (ref 4.69–6.13)
RDW, POC: 15.4 %
WBC: 4.2 10*3/uL — AB (ref 4.6–10.2)

## 2013-10-23 MED ORDER — ALBUTEROL SULFATE HFA 108 (90 BASE) MCG/ACT IN AERS
2.0000 | INHALATION_SPRAY | Freq: Four times a day (QID) | RESPIRATORY_TRACT | Status: DC | PRN
Start: 2013-10-23 — End: 2013-12-07

## 2013-10-23 MED ORDER — AZITHROMYCIN 500 MG PO TABS: 500.0000 mg | ORAL_TABLET | Freq: Every day | ORAL | Status: DC

## 2013-10-23 NOTE — Progress Notes (Signed)
   Subjective:    Patient ID: Craig Richard, male    DOB: November 30, 1921, 80 y.o.   MRN: [id number]  HPI  80 YO male patient presents to the clinic today with complaints of a cough that started about a week ago. The cough is productive with a greenish yellow tinted sputum. The cough is not keeping him up at night. He states he has a little congestion in his nasal cavity.   Denies fever, ear pain or sore throat, SOB or DOE.   Pt did have his flu shot this year. He had his pneumonia immunization about 4 years ago.    Review of Systems     Objective:   Physical Exam  Constitutional: He is oriented to person, place, and time. He appears well-developed. He is cooperative. He has a sickly appearance.  HENT:  Head: Normocephalic.  Nose: Nose normal.  Mouth/Throat: Oropharynx is clear and moist.  Eyes: EOM are normal.  Neck: Normal range of motion.  Cardiovascular: Normal rate and normal pulses.  An irregularly irregular rhythm present.  Pulmonary/Chest: No accessory muscle usage. Tachypnea noted. He has decreased breath sounds. He has wheezes. He has rhonchi. He has rales.  Neurological: He is alert and oriented to person, place, and time.  Psychiatric: He has a normal mood and affect. His behavior is normal.     [facility] reading (PRIMARY) by  Dr Elder Cyphers rales right base, possible righr middle and lower infiltrate Results for orders placed in visit on 10/23/13  POCT CBC      Result Value Ref Range   WBC 4.2 (*) 4.6 - 10.2 K/uL   Lymph, poc 1.1  0.6 - 3.4   POC LYMPH PERCENT 26.1  10 - 50 %L   MID (cbc) 0.5  0 - 0.9   POC MID % 12.7 (*) 0 - 12 %M   POC Granulocyte 2.6  2 - 6.9   Granulocyte percent 61.2  37 - 80 %G   RBC 4.38 (*) 4.69 - 6.13 M/uL   Hemoglobin 13.6 (*) 14.1 - 18.1 g/dL   HCT, POC 43.5  43.5 - 53.7 %   MCV 99.3 (*) 80 - 97 fL   MCH, POC 31.1  27 - 31.2 pg   MCHC 31.3 (*) 31.8 - 35.4 g/dL   RDW, POC 15.4     Platelet Count, POC 170  142 - 424 K/uL   MPV 9.1  0 - 99.8  fL        Assessment & Plan:  Zithromax 500mg /albuterol inhaler Return for Neb if no improvement/See family doctor next week. Call coumadin clinic to adjust dose while on Zithromax

## 2013-10-23 NOTE — Patient Instructions (Signed)
Acute Bronchitis Bronchitis is inflammation of the airways that extend from the windpipe into the lungs (bronchi). The inflammation often causes mucus to develop. This leads to a cough, which is the most common symptom of bronchitis.  In acute bronchitis, the condition usually develops suddenly and goes away over time, usually in a couple weeks. Smoking, allergies, and asthma can make bronchitis worse. Repeated episodes of bronchitis may cause further lung problems.  CAUSES Acute bronchitis is most often caused by the same virus that causes a cold. The virus can spread from person to person (contagious).  SIGNS AND SYMPTOMS   Cough.   Fever.   Coughing up mucus.   Body aches.   Chest congestion.   Chills.   Shortness of breath.   Sore throat.  DIAGNOSIS  Acute bronchitis is usually diagnosed through a physical exam. Tests, such as chest X-rays, are sometimes done to rule out other conditions.  TREATMENT  Acute bronchitis usually goes away in a couple weeks. Often times, no medical treatment is necessary. Medicines are sometimes given for relief of fever or cough. Antibiotics are usually not needed but may be prescribed in certain situations. In some cases, an inhaler may be recommended to help reduce shortness of breath and control the cough. A cool mist vaporizer may also be used to help thin bronchial secretions and make it easier to clear the chest.  HOME CARE INSTRUCTIONS  Get plenty of rest.   Drink enough fluids to keep your urine clear or pale yellow (unless you have a medical condition that requires fluid restriction). Increasing fluids may help thin your secretions and will prevent dehydration.   Only take over-the-counter or prescription medicines as directed by your health care provider.   Avoid smoking and secondhand smoke. Exposure to cigarette smoke or irritating chemicals will make bronchitis worse. If you are a smoker, consider using nicotine gum or skin  patches to help control withdrawal symptoms. Quitting smoking will help your lungs heal faster.   Reduce the chances of another bout of acute bronchitis by washing your hands frequently, avoiding people with cold symptoms, and trying not to touch your hands to your mouth, nose, or eyes.   Follow up with your health care provider as directed.  SEEK MEDICAL CARE IF: Your symptoms do not improve after 1 week of treatment.  SEEK IMMEDIATE MEDICAL CARE IF:  You develop an increased fever or chills.   You have chest pain.   You have severe shortness of breath.  You have bloody sputum.   You develop dehydration.  You develop fainting.  You develop repeated vomiting.  You develop a severe headache. MAKE SURE YOU:   Understand these instructions.  Will watch your condition.  Will get help right away if you are not doing well or get worse. Document Released: 09/13/2004 Document Revised: 04/08/2013 Document Reviewed: 01/27/2013 [facility] Patient Information 2014 [place]. Albuterol inhalation aerosol What is this medicine? ALBUTEROL (al Normajean [place]) is a bronchodilator. It helps open up the airways in your lungs to make it easier to breathe. This medicine is used to treat and to prevent bronchospasm. This medicine may be used for other purposes; ask your health care provider or pharmacist if you have questions. COMMON BRAND NAME(S): Proair HFA, Proventil HFA, Proventil, Respirol , Ventolin HFA, Ventolin What should I tell my health care provider before I take this medicine? They need to know if you have any of the following conditions: -diabetes -heart disease or irregular heartbeat -high  blood pressure -pheochromocytoma -seizures -thyroid disease -an unusual or allergic reaction to albuterol, levalbuterol, sulfites, other medicines, foods, dyes, or preservatives -pregnant or trying to get pregnant -breast-feeding How should I use this medicine? This medicine is  for inhalation through the mouth. Follow the directions on your prescription label. Take your medicine at regular intervals. Do not use more often than directed. Make sure that you are using your inhaler correctly. Ask you doctor or health care provider if you have any questions. Talk to your pediatrician regarding the use of this medicine in children. Special care may be needed. Overdosage: If you think you have taken too much of this medicine contact a poison control center or emergency room at once. NOTE: This medicine is only for you. Do not share this medicine with others. What if I miss a dose? If you miss a dose, use it as soon as you can. If it is almost time for your next dose, use only that dose. Do not use double or extra doses. What may interact with this medicine? -anti-infectives like chloroquine and pentamidine -caffeine -cisapride -diuretics -medicines for colds -medicines for depression or for emotional or psychotic conditions -medicines for weight loss including some herbal products -methadone -some antibiotics like clarithromycin, erythromycin, levofloxacin, and linezolid -some heart medicines -steroid hormones like dexamethasone, cortisone, hydrocortisone -theophylline -thyroid hormones This list may not describe all possible interactions. Give your health care provider a list of all the medicines, herbs, non-prescription drugs, or dietary supplements you use. Also tell them if you smoke, drink alcohol, or use illegal drugs. Some items may interact with your medicine. What should I watch for while using this medicine? Tell your doctor or health care professional if your symptoms do not improve. Do not use extra albuterol. If your asthma or bronchitis gets worse while you are using this medicine, call your doctor right away. If your mouth gets dry try chewing sugarless gum or sucking hard candy. Drink water as directed. What side effects may I notice from receiving this  medicine? Side effects that you should report to your doctor or health care professional as soon as possible: -allergic reactions like skin rash, itching or hives, swelling of the face, lips, or tongue -breathing problems -chest pain -feeling faint or lightheaded, falls -high blood pressure -irregular heartbeat -fever -muscle cramps or weakness -pain, tingling, numbness in the hands or feet -vomiting Side effects that usually do not require medical attention (report to your doctor or health care professional if they continue or are bothersome): -cough -difficulty sleeping -headache -nervousness or trembling -stomach upset -stuffy or runny nose -throat irritation -unusual taste This list may not describe all possible side effects. Call your doctor for medical advice about side effects. You may report side effects to FDA at 1-800-FDA-1088. Where should I keep my medicine? Keep out of the reach of children. Store at room temperature between 15 and 30 degrees C (59 and 86 degrees F). The contents are under pressure and may burst when exposed to heat or flame. Do not freeze. This medicine does not work as well if it is too cold. Throw away any unused medicine after the expiration date. Inhalers need to be thrown away after the labeled number of puffs have been used or by the expiration date; whichever comes first. Ventolin HFA should be thrown away 12 months after removing from foil pouch. Check the instructions that come with your medicine. NOTE: This sheet is a summary. It may not cover all possible information. If  you have questions about this medicine, talk to your doctor, pharmacist, or health care provider.  2014, Elsevier/Gold Standard. (2013-01-22 10:57:17)

## 2013-10-23 NOTE — Progress Notes (Signed)
   Subjective:    Patient ID: Craig Richard, male    DOB: 1921-11-19, 80 y.o.   MRN: [id number]  HPI    Review of Systems     Objective:   Physical Exam        Assessment & Plan:

## 2013-11-02 DIAGNOSIS — I4891 Unspecified atrial fibrillation: Secondary | ICD-10-CM | POA: Diagnosis not present

## 2013-11-02 DIAGNOSIS — E782 Mixed hyperlipidemia: Secondary | ICD-10-CM | POA: Diagnosis not present

## 2013-11-02 DIAGNOSIS — E119 Type 2 diabetes mellitus without complications: Secondary | ICD-10-CM | POA: Diagnosis not present

## 2013-11-02 DIAGNOSIS — D509 Iron deficiency anemia, unspecified: Secondary | ICD-10-CM | POA: Diagnosis not present

## 2013-11-02 DIAGNOSIS — I10 Essential (primary) hypertension: Secondary | ICD-10-CM | POA: Diagnosis not present

## 2013-11-02 DIAGNOSIS — Z7901 Long term (current) use of anticoagulants: Secondary | ICD-10-CM | POA: Diagnosis not present

## 2013-11-06 DIAGNOSIS — I2510 Atherosclerotic heart disease of native coronary artery without angina pectoris: Secondary | ICD-10-CM | POA: Diagnosis not present

## 2013-11-06 DIAGNOSIS — I10 Essential (primary) hypertension: Secondary | ICD-10-CM | POA: Diagnosis not present

## 2013-11-06 DIAGNOSIS — I4891 Unspecified atrial fibrillation: Secondary | ICD-10-CM | POA: Diagnosis not present

## 2013-11-06 DIAGNOSIS — E1129 Type 2 diabetes mellitus with other diabetic kidney complication: Secondary | ICD-10-CM | POA: Diagnosis not present

## 2013-11-16 DIAGNOSIS — I4891 Unspecified atrial fibrillation: Secondary | ICD-10-CM | POA: Diagnosis not present

## 2013-11-16 DIAGNOSIS — Z7901 Long term (current) use of anticoagulants: Secondary | ICD-10-CM | POA: Diagnosis not present

## 2013-12-07 ENCOUNTER — Ambulatory Visit (INDEPENDENT_AMBULATORY_CARE_PROVIDER_SITE_OTHER): Payer: Medicare Other | Admitting: Cardiovascular Disease

## 2013-12-07 ENCOUNTER — Encounter: Payer: Self-pay | Admitting: Cardiovascular Disease

## 2013-12-07 VITALS — BP 162/92 | HR 70 | Ht 70.0 in | Wt 170.0 lb

## 2013-12-07 DIAGNOSIS — I10 Essential (primary) hypertension: Secondary | ICD-10-CM

## 2013-12-07 DIAGNOSIS — E785 Hyperlipidemia, unspecified: Secondary | ICD-10-CM | POA: Diagnosis not present

## 2013-12-07 DIAGNOSIS — I4891 Unspecified atrial fibrillation: Secondary | ICD-10-CM | POA: Diagnosis not present

## 2013-12-07 DIAGNOSIS — I2510 Atherosclerotic heart disease of native coronary artery without angina pectoris: Secondary | ICD-10-CM | POA: Diagnosis not present

## 2013-12-07 NOTE — Assessment & Plan Note (Signed)
Rate controlled on Coumadin anticoagulation 

## 2013-12-07 NOTE — Assessment & Plan Note (Signed)
On statin therapy with his most recent lipid profile performed by his PCP on 11/02/13 revealing a total cholesterol of 111, LDL of 52 and HDL of 51

## 2013-12-07 NOTE — Patient Instructions (Signed)
We request that you follow-up in: 6 months with Laura Ingold NP and in 1 year with Dr Berry  You will receive a reminder letter in the mail two months in advance. If you don't receive a letter, please call our office to schedule the follow-up appointment.   

## 2013-12-07 NOTE — Assessment & Plan Note (Signed)
Mildly elevated today compared to his last check on 11/02/13

## 2013-12-07 NOTE — Assessment & Plan Note (Signed)
Status post cardiac catheterization by myself 09/17/08 in the setting of acute interval myocardial function. Mild without apical LAD lesion which I angioplastied. At that time his ejection fraction was 40% with apical dyskinesia. Echo performed in followup 06/29/10 was entirely normal and Myoview showed only a small apical scar. He denies chest pain or shortness of breath.

## 2013-12-07 NOTE — Progress Notes (Signed)
12/07/2013 Craig Richard   1921-12-27  [id number]  Primary Physician [name], MD Primary Cardiologist: Lorretta Harp MD Renae Gloss   HPI:   The patient is a very pleasant, 80 year old, thin-appearing, widowed Caucasian male, father of 2 daughters, grandfather to 4 grandchildren who I last saw in the office 6 months ago. His wife of 18 years died in 09/18/2009. He lives alone but his children are close by. He was formerly a patient of Dr. Roe Rutherford. He has a history of remote tobacco abuse as well as treated hypertension and hyperlipidemia. He has chronic A-fib well controlled on Coumadin anticoagulation which is checked by Elliot Gault at [facility]. He presented September 17, 2008, 7:30 in the morning with an acute anterior wall myocardial infarction. I catheterized him and ultimately found an apical LAD lesion which I angioplastied. At that time, he had apical dyskinesia with an EF of 40% but a followup echo performed June 29, 2010, was entirely normal and a Myoview stress test showed only a small apical scar. He is otherwise asymptomatic. His most recent lab work performed 11/02/13 revealed a total cholesterol 111, LDL of 52 and HDL of 51..     Current Outpatient Prescriptions  Medication Sig Dispense Refill  . atorvastatin (LIPITOR) 40 MG tablet Take 40 mg by mouth every other day.      . isosorbide mononitrate (IMDUR) 60 MG 24 hr tablet Take 60 mg by mouth daily before breakfast.      . metoprolol (LOPRESSOR) 50 MG tablet Take 75 mg by mouth 2 (two) times daily.      . nitroGLYCERIN (NITROSTAT) 0.4 MG SL tablet Place 0.4 mg under the tongue every 5 (five) minutes as needed for chest pain.      . ramipril (ALTACE) 10 MG capsule Take 10 mg by mouth daily.      Marland Kitchen warfarin (COUMADIN) 5 MG tablet Take 0.5-1 tablets (2.5-5 mg total) by mouth daily. Takes 1/2 tablet on Sunday Monday Wednesday and Friday and 1 tablet on Tuesday Thursday and Saturday  1  tablet  0   No current facility-administered medications for this visit.    No Known Allergies  History   Social History  . Marital Status: Widowed    Spouse Name: N/A    Number of Children: 2  . Years of Education: B.S. [employer]   Occupational History  .     Social History Main Topics  . Smoking status: Never Smoker   . Smokeless tobacco: Current User    Types: Chew     Comment: occasionally  . Alcohol Use: No  . Drug Use: Not on file  . Sexual Activity: Not on file   Other Topics Concern  . Not on file   Social History Narrative  . No narrative on file     Review of Systems: General: negative for chills, fever, night sweats or weight changes.  Cardiovascular: negative for chest pain, dyspnea on exertion, edema, orthopnea, palpitations, paroxysmal nocturnal dyspnea or shortness of breath Dermatological: negative for rash Respiratory: negative for cough or wheezing Urologic: negative for hematuria Abdominal: negative for nausea, vomiting, diarrhea, bright red blood per rectum, melena, or hematemesis Neurologic: negative for visual changes, syncope, or dizziness All other systems reviewed and are otherwise negative except as noted above.    Blood pressure 162/92, pulse 70, height 5\' 10"  (1.778 m), weight 170 lb (77.111 kg).  General appearance: alert and no distress Neck: no adenopathy, no carotid bruit,  no JVD, supple, symmetrical, trachea midline and thyroid not enlarged, symmetric, no tenderness/mass/nodules Lungs: clear to auscultation bilaterally Heart: irregularly irregular rhythm Extremities: extremities normal, atraumatic, no cyanosis or edema  EKG atrial fibrillation with a ventricular response of 70  ASSESSMENT AND PLAN:   Atrial fibrillation Rate controlled on Coumadin anticoagulation  Hyperlipidemia On statin therapy with his most recent lipid profile performed by his PCP on 11/02/13 revealing a total cholesterol of 111, LDL of 52 and HDL of  51  Hypertension Mildly elevated today compared to his last check on 11/02/13  CAD in native artery, wth hx ant MI, wth PTCA to LAD, 2010 Status post cardiac catheterization by myself 09/17/08 in the setting of acute interval myocardial function. Mild without apical LAD lesion which I angioplastied. At that time his ejection fraction was 40% with apical dyskinesia. Echo performed in followup 06/29/10 was entirely normal and Myoview showed only a small apical scar. He denies chest pain or shortness of breath.      Lorretta Harp MD FACP,FACC,FAHA, [facility] 12/07/2013 11:19 AM

## 2013-12-15 DIAGNOSIS — Z7901 Long term (current) use of anticoagulants: Secondary | ICD-10-CM | POA: Diagnosis not present

## 2013-12-15 DIAGNOSIS — I4891 Unspecified atrial fibrillation: Secondary | ICD-10-CM | POA: Diagnosis not present

## 2014-01-14 DIAGNOSIS — I4891 Unspecified atrial fibrillation: Secondary | ICD-10-CM | POA: Diagnosis not present

## 2014-01-14 DIAGNOSIS — Z7901 Long term (current) use of anticoagulants: Secondary | ICD-10-CM | POA: Diagnosis not present

## 2014-02-11 DIAGNOSIS — I4891 Unspecified atrial fibrillation: Secondary | ICD-10-CM | POA: Diagnosis not present

## 2014-02-11 DIAGNOSIS — Z7901 Long term (current) use of anticoagulants: Secondary | ICD-10-CM | POA: Diagnosis not present

## 2014-03-16 DIAGNOSIS — I4891 Unspecified atrial fibrillation: Secondary | ICD-10-CM | POA: Diagnosis not present

## 2014-03-16 DIAGNOSIS — Z7901 Long term (current) use of anticoagulants: Secondary | ICD-10-CM | POA: Diagnosis not present

## 2014-04-02 ENCOUNTER — Telehealth: Payer: Self-pay | Admitting: Cardiovascular Disease

## 2014-04-02 NOTE — Telephone Encounter (Signed)
Closed encounter °

## 2014-04-13 DIAGNOSIS — I4891 Unspecified atrial fibrillation: Secondary | ICD-10-CM | POA: Diagnosis not present

## 2014-04-13 DIAGNOSIS — Z7901 Long term (current) use of anticoagulants: Secondary | ICD-10-CM | POA: Diagnosis not present

## 2014-04-27 ENCOUNTER — Other Ambulatory Visit: Payer: Self-pay | Admitting: Dermatology

## 2014-04-27 DIAGNOSIS — D485 Neoplasm of uncertain behavior of skin: Secondary | ICD-10-CM | POA: Diagnosis not present

## 2014-04-27 DIAGNOSIS — L988 Other specified disorders of the skin and subcutaneous tissue: Secondary | ICD-10-CM | POA: Diagnosis not present

## 2014-04-27 DIAGNOSIS — L821 Other seborrheic keratosis: Secondary | ICD-10-CM | POA: Diagnosis not present

## 2014-05-04 DIAGNOSIS — Z961 Presence of intraocular lens: Secondary | ICD-10-CM | POA: Diagnosis not present

## 2014-05-04 DIAGNOSIS — E119 Type 2 diabetes mellitus without complications: Secondary | ICD-10-CM | POA: Diagnosis not present

## 2014-05-13 DIAGNOSIS — E119 Type 2 diabetes mellitus without complications: Secondary | ICD-10-CM | POA: Diagnosis not present

## 2014-05-13 DIAGNOSIS — I10 Essential (primary) hypertension: Secondary | ICD-10-CM | POA: Diagnosis not present

## 2014-05-13 DIAGNOSIS — I4891 Unspecified atrial fibrillation: Secondary | ICD-10-CM | POA: Diagnosis not present

## 2014-05-13 DIAGNOSIS — Z7901 Long term (current) use of anticoagulants: Secondary | ICD-10-CM | POA: Diagnosis not present

## 2014-05-21 DIAGNOSIS — I10 Essential (primary) hypertension: Secondary | ICD-10-CM | POA: Diagnosis not present

## 2014-05-21 DIAGNOSIS — E1121 Type 2 diabetes mellitus with diabetic nephropathy: Secondary | ICD-10-CM | POA: Diagnosis not present

## 2014-05-21 DIAGNOSIS — E119 Type 2 diabetes mellitus without complications: Secondary | ICD-10-CM | POA: Diagnosis not present

## 2014-05-21 DIAGNOSIS — E782 Mixed hyperlipidemia: Secondary | ICD-10-CM | POA: Diagnosis not present

## 2014-06-07 ENCOUNTER — Encounter: Payer: Self-pay | Admitting: Cardiology

## 2014-06-07 ENCOUNTER — Ambulatory Visit (INDEPENDENT_AMBULATORY_CARE_PROVIDER_SITE_OTHER): Payer: Medicare Other | Admitting: Cardiology

## 2014-06-07 VITALS — BP 114/80 | HR 71 | Ht 71.0 in | Wt 172.6 lb

## 2014-06-07 DIAGNOSIS — I2510 Atherosclerotic heart disease of native coronary artery without angina pectoris: Secondary | ICD-10-CM | POA: Diagnosis not present

## 2014-06-07 DIAGNOSIS — E785 Hyperlipidemia, unspecified: Secondary | ICD-10-CM | POA: Diagnosis not present

## 2014-06-07 DIAGNOSIS — I4891 Unspecified atrial fibrillation: Secondary | ICD-10-CM | POA: Diagnosis not present

## 2014-06-07 DIAGNOSIS — I10 Essential (primary) hypertension: Secondary | ICD-10-CM

## 2014-06-07 MED ORDER — NITROGLYCERIN 0.4 MG SL SUBL: 0.4000 mg | SUBLINGUAL_TABLET | SUBLINGUAL | Status: AC | PRN

## 2014-06-07 NOTE — Assessment & Plan Note (Signed)
Rate controlled, no bleeding complaints on coumadin, followed by Reinaldo Meeker at [place]

## 2014-06-07 NOTE — Assessment & Plan Note (Signed)
Stable at last check in March.

## 2014-06-07 NOTE — Assessment & Plan Note (Signed)
controlled 

## 2014-06-07 NOTE — Patient Instructions (Signed)
Your physician wants you to follow-up in:6 months or sooner if needed with Dr. Gwenlyn Found. You will receive a reminder letter in the mail two months in advance. If you don't receive a letter, please call our office to schedule the follow-up appointment.

## 2014-06-07 NOTE — Progress Notes (Signed)
06/07/2014   PCP: [name], MD   Chief Complaint  Patient presents with  . 6 mo rov    patient reports having no problems since last visit.    Primary Cardiologist:Dr. Adora Fridge   HPI:  80 year old, thin-appearing, widowed Caucasian male, father of 2 daughters, grandfather to 4 grandchildren who is followed by Dr. Adora Fridge.  Dr. Gwenlyn Found saw last in April of this year.  The pt's wife of 50 years died in 08-30-2009. He lives alone but his children are close by. He was formerly a patient of Dr. Roe Rutherford. He has a history of remote tobacco abuse as well as treated hypertension and hyperlipidemia. He has chronic A-fib well controlled on Coumadin anticoagulation which is checked by Elliot Gault at [facility]. He presented September 17, 2008, 7:30 in the morning with an acute anterior wall myocardial infarction. Cath found an apical LAD lesion which was angioplastied. At that time, he had apical dyskinesia with an EF of 40% but a followup echo performed June 29, 2010, was entirely normal and a Myoview stress test showed only a small apical scar. He is otherwise asymptomatic. His most recent lab work performed 11/02/13 revealed a total cholesterol 111, LDL of 52 and HDL of 51.Marland Kitchen   He is back for follow up today without complaints.  No falls, but did scratch rt arm and it is dressed, no sign of infection.  No falls.  Some vertigo but he monitors his walking with cane.    No Known Allergies  Current Outpatient Prescriptions  Medication Sig Dispense Refill  . atorvastatin (LIPITOR) 40 MG tablet Take 40 mg by mouth every other day.      . isosorbide mononitrate (IMDUR) 60 MG 24 hr tablet Take 60 mg by mouth daily before breakfast.      . metoprolol (LOPRESSOR) 50 MG tablet Take 75 mg by mouth 2 (two) times daily.      . ramipril (ALTACE) 10 MG capsule Take 10 mg by mouth daily.      Marland Kitchen warfarin (COUMADIN) 5 MG tablet Take 2.5-5 mg by mouth daily. Takes as directed  per INR      . nitroGLYCERIN (NITROSTAT) 0.4 MG SL tablet Place 1 tablet (0.4 mg total) under the tongue every 5 (five) minutes as needed for chest pain.  25 tablet  6   No current facility-administered medications for this visit.    Past Medical History  Diagnosis Date  . Atrial fibrillation     coumadin - follow by Reinaldo Meeker at [facility]  . Hyperlipidemia   . Hypertension   . History of anterior wall myocardial infarction 09/17/2008  . History of nuclear stress test 06/14/2010    bruce protocol myoview; stress images show perfusion defect in apical wall (scar)  . CAD (coronary artery disease)     Past Surgical History  Procedure Laterality Date  . Eye surgery Bilateral     cataract  . Esophagogastroduodenoscopy N/A 10/29/2012    Procedure: ESOPHAGOGASTRODUODENOSCOPY (EGD);  Surgeon: Arta Silence, MD;  Location: [facility];  Service: Endoscopy;  Laterality: N/A;  . Hot hemostasis N/A 10/29/2012    Procedure: HOT HEMOSTASIS (ARGON PLASMA COAGULATION/BICAP);  Surgeon: Arta Silence, MD;  Location: [facility];  Service: Endoscopy;  Laterality: N/A;  . Coronary angioplasty  09/17/2008    r/t acute anterior wall MI; apical LAD lesion  . Transthoracic echocardiogram  06/29/2010    EF=>55%, normal LV systolic function; LA &  RA mod dilated; mild mitral annular calcif & mild MR; mod TR & elevated RV systolic pressure (mild pulm HTN); AV mildly sclerotic; mild pulm valve regurg  . Prostate surgery  2000  . Hemorrhoid surgery  2005    XIP:JASNKNL:ZJ colds or fevers, no weight changes Skin:no rashes or ulcers HEENT:no blurred vision, no congestion CV:see HPI PUL:see HPI GI:no diarrhea constipation or melena, no indigestion GU:no hematuria, no dysuria MS:no joint pain, no claudication Neuro:no syncope, + lightheadedness with vertigo, followed by PCP Endo:no diabetes, no thyroid disease  Wt Readings from Last 3 Encounters:  06/07/14 172 lb 9.6 oz (78.291 kg)  12/07/13 170 lb  (77.111 kg)  10/23/13 167 lb (75.751 kg)    PHYSICAL EXAM BP 114/80  Pulse 71  Ht 5\' 11"  (1.803 m)  Wt 172 lb 9.6 oz (78.291 kg)  BMI 24.08 kg/m2 General:Pleasant affect, NAD Skin:Warm and dry, brisk capillary refill HEENT:normocephalic, sclera clear, mucus membranes moist Neck:supple, no JVD, no bruits, no adenopathy  Heart:S1S2 RRR without murmur, gallup, rub or click Lungs:clear without rales, rhonchi, or wheezes QBH:ALPF, non tender, + BS, do not palpate liver spleen or masses Ext:tr lower ext edema- resolves at night, 2+ post tib pulses, 2+ radial pulses Neuro:alert and oriented X 3, MAE, follows commands, + facial symmetry  XTK:WIOXBD fib nonspecific T wave abnormality. Rate 71  ASSESSMENT AND PLAN Atrial fibrillation Rate controlled, no bleeding complaints on coumadin, followed by Reinaldo Meeker at [facility] medical   CAD in native artery, wth hx ant MI, wth PTCA to LAD, 2010 No chest pain, no SOB  Hyperlipidemia Stable at last check in March.  Hypertension controlled  follow up with Dr. Adora Fridge in 6 months.

## 2014-06-07 NOTE — Assessment & Plan Note (Signed)
No chest pain, no SOB 

## 2014-06-10 DIAGNOSIS — I482 Chronic atrial fibrillation: Secondary | ICD-10-CM | POA: Diagnosis not present

## 2014-06-10 DIAGNOSIS — Z23 Encounter for immunization: Secondary | ICD-10-CM | POA: Diagnosis not present

## 2014-06-10 DIAGNOSIS — Z7901 Long term (current) use of anticoagulants: Secondary | ICD-10-CM | POA: Diagnosis not present

## 2014-07-12 DIAGNOSIS — I482 Chronic atrial fibrillation: Secondary | ICD-10-CM | POA: Diagnosis not present

## 2014-07-12 DIAGNOSIS — Z7901 Long term (current) use of anticoagulants: Secondary | ICD-10-CM | POA: Diagnosis not present

## 2014-08-23 DIAGNOSIS — Z7901 Long term (current) use of anticoagulants: Secondary | ICD-10-CM | POA: Diagnosis not present

## 2014-08-23 DIAGNOSIS — I482 Chronic atrial fibrillation: Secondary | ICD-10-CM | POA: Diagnosis not present

## 2014-09-20 DIAGNOSIS — Z23 Encounter for immunization: Secondary | ICD-10-CM | POA: Diagnosis not present

## 2014-09-20 DIAGNOSIS — Z7901 Long term (current) use of anticoagulants: Secondary | ICD-10-CM | POA: Diagnosis not present

## 2014-09-20 DIAGNOSIS — I482 Chronic atrial fibrillation: Secondary | ICD-10-CM | POA: Diagnosis not present

## 2014-10-19 DIAGNOSIS — Z7901 Long term (current) use of anticoagulants: Secondary | ICD-10-CM | POA: Diagnosis not present

## 2014-10-19 DIAGNOSIS — I482 Chronic atrial fibrillation: Secondary | ICD-10-CM | POA: Diagnosis not present

## 2014-11-10 DIAGNOSIS — I10 Essential (primary) hypertension: Secondary | ICD-10-CM | POA: Diagnosis not present

## 2014-11-10 DIAGNOSIS — E782 Mixed hyperlipidemia: Secondary | ICD-10-CM | POA: Diagnosis not present

## 2014-11-10 DIAGNOSIS — E118 Type 2 diabetes mellitus with unspecified complications: Secondary | ICD-10-CM | POA: Diagnosis not present

## 2014-11-16 DIAGNOSIS — I482 Chronic atrial fibrillation: Secondary | ICD-10-CM | POA: Diagnosis not present

## 2014-11-16 DIAGNOSIS — Z7901 Long term (current) use of anticoagulants: Secondary | ICD-10-CM | POA: Diagnosis not present

## 2014-11-19 DIAGNOSIS — E119 Type 2 diabetes mellitus without complications: Secondary | ICD-10-CM | POA: Diagnosis not present

## 2014-11-19 DIAGNOSIS — I10 Essential (primary) hypertension: Secondary | ICD-10-CM | POA: Diagnosis not present

## 2014-11-19 DIAGNOSIS — E1121 Type 2 diabetes mellitus with diabetic nephropathy: Secondary | ICD-10-CM | POA: Diagnosis not present

## 2014-11-19 DIAGNOSIS — E782 Mixed hyperlipidemia: Secondary | ICD-10-CM | POA: Diagnosis not present

## 2014-12-08 ENCOUNTER — Ambulatory Visit (INDEPENDENT_AMBULATORY_CARE_PROVIDER_SITE_OTHER): Payer: Medicare Other | Admitting: Cardiovascular Disease

## 2014-12-08 ENCOUNTER — Encounter: Payer: Self-pay | Admitting: Cardiovascular Disease

## 2014-12-08 VITALS — BP 140/80 | HR 76 | Ht 71.0 in | Wt 167.9 lb

## 2014-12-08 DIAGNOSIS — I2510 Atherosclerotic heart disease of native coronary artery without angina pectoris: Secondary | ICD-10-CM

## 2014-12-08 DIAGNOSIS — I10 Essential (primary) hypertension: Secondary | ICD-10-CM

## 2014-12-08 DIAGNOSIS — E785 Hyperlipidemia, unspecified: Secondary | ICD-10-CM | POA: Diagnosis not present

## 2014-12-08 DIAGNOSIS — I482 Chronic atrial fibrillation, unspecified: Secondary | ICD-10-CM

## 2014-12-08 NOTE — Assessment & Plan Note (Addendum)
History of CAD status post apical LAD intervention by myself 09/17/08 after he presented with an anterior wall myocardial infarction. His initial EF was 40% which normalized subsequently in my view showed apical scar without ischemia. He denies chest pain or shortness of breath.

## 2014-12-08 NOTE — Progress Notes (Signed)
12/08/2014 Craig Richard   August 01, 1922  [id number]  Primary Physician [name], MD Primary Cardiologist: Lorretta Harp MD Craig Richard   HPI:  The patient is a very pleasant, 81 year old, thin-appearing, widowed Caucasian male, father of 2 daughters, grandfather to 4 grandchildren who I last saw in the office 12 months ago. His wife of 9 years died in 10/05/2009. He lives alone but his children are close by. He was formerly a patient of Dr. Roe Rutherford. He has a history of remote tobacco abuse as well as treated hypertension and hyperlipidemia. He has chronic A-fib well controlled on Coumadin anticoagulation which is checked by Elliot Gault at [facility]. He presented September 17, 2008, 7:30 in the morning with an acute anterior wall myocardial infarction. I catheterized him and ultimately found an apical LAD lesion which I angioplastied. At that time, he had apical dyskinesia with an EF of 40% but a followup echo performed June 29, 2010, was entirely normal and a Myoview stress test showed only a small apical scar. He is otherwise asymptomatic. His primary care physician follows his lipid profile.  Current Outpatient Prescriptions  Medication Sig Dispense Refill  . amoxicillin (AMOXIL) 500 MG capsule Take 500 mg by mouth 4 (four) times daily. Every 6 hours  0  . atorvastatin (LIPITOR) 40 MG tablet Take 40 mg by mouth every other day.    . isosorbide mononitrate (IMDUR) 60 MG 24 hr tablet Take 60 mg by mouth daily before breakfast.    . meclizine (ANTIVERT) 25 MG tablet Take 1 tablet by mouth as needed.  6  . metoprolol (LOPRESSOR) 50 MG tablet Take 75 mg by mouth 2 (two) times daily.    . nitroGLYCERIN (NITROSTAT) 0.4 MG SL tablet Place 1 tablet (0.4 mg total) under the tongue every 5 (five) minutes as needed for chest pain. 25 tablet 6  . ramipril (ALTACE) 10 MG capsule Take 10 mg by mouth daily.    Marland Kitchen warfarin (COUMADIN) 5 MG tablet Take 2.5-5 mg by  mouth daily. Takes as directed per INR     No current facility-administered medications for this visit.    No Known Allergies  History   Social History  . Marital Status: Widowed    Spouse Name: N/A  . Number of Children: 2  . Years of Education: B.S. [employer]   Occupational History  .     Social History Main Topics  . Smoking status: Never Smoker   . Smokeless tobacco: Current User    Types: Chew     Comment: occasionally  . Alcohol Use: No  . Drug Use: Not on file  . Sexual Activity: Not on file   Other Topics Concern  . Not on file   Social History Narrative     Review of Systems: General: negative for chills, fever, night sweats or weight changes.  Cardiovascular: negative for chest pain, dyspnea on exertion, edema, orthopnea, palpitations, paroxysmal nocturnal dyspnea or shortness of breath Dermatological: negative for rash Respiratory: negative for cough or wheezing Urologic: negative for hematuria Abdominal: negative for nausea, vomiting, diarrhea, bright red blood per rectum, melena, or hematemesis Neurologic: negative for visual changes, syncope, or dizziness All other systems reviewed and are otherwise negative except as noted above.    Blood pressure 140/80, pulse 76, height 5\' 11"  (1.803 m), weight 167 lb 14.4 oz (76.159 kg).  General appearance: alert and no distress Neck: no adenopathy, no carotid bruit, no JVD, supple, symmetrical, trachea midline and  thyroid not enlarged, symmetric, no tenderness/mass/nodules Lungs: clear to auscultation bilaterally Heart: irregularly irregular rhythm Extremities: 1+ edema bilaterally  EKG atrial fibrillation with a ventricular response of 76 and left axis deviation. I personally reviewed this EKG  ASSESSMENT AND PLAN:   Hypertension History of hypertension with blood pressure measured at 140/80. He is on metoprolol and ramipril. Continue current meds at current dosing   Hyperlipidemia History of hyperlipidemia  on atorvastatin 40 mg a day followed by his PCP   CAD in native artery, wth hx ant MI, wth PTCA to LAD, 2010 History of CAD status post apical LAD intervention by myself 09/17/08 after he presented with an anterior wall myocardial infarction. His initial EF was 40% which normalized subsequently in my view showed apical scar without ischemia. He denies chest pain or shortness of breath.   Atrial fibrillation History of chronic atrial fibrillation rate controlled on Coumadin anticoagulation       Lorretta Harp MD [facility], [facility] 12/08/2014 2:10 PM

## 2014-12-08 NOTE — Assessment & Plan Note (Signed)
History of chronic atrial fibrillation rate controlled on Coumadin anticoagulation. 

## 2014-12-08 NOTE — Assessment & Plan Note (Signed)
History of hyperlipidemia on atorvastatin 40 mg a day followed by his PCP 

## 2014-12-08 NOTE — Patient Instructions (Signed)
Your physician wants you to follow-up in: 1 year with Dr Berry. You will receive a reminder letter in the mail two months in advance. If you don't receive a letter, please call our office to schedule the follow-up appointment.  

## 2014-12-08 NOTE — Assessment & Plan Note (Signed)
History of hypertension with blood pressure measured at 140/80. He is on metoprolol and ramipril. Continue current meds at current dosing

## 2014-12-14 DIAGNOSIS — Z7901 Long term (current) use of anticoagulants: Secondary | ICD-10-CM | POA: Diagnosis not present

## 2014-12-14 DIAGNOSIS — I482 Chronic atrial fibrillation: Secondary | ICD-10-CM | POA: Diagnosis not present

## 2015-01-10 DIAGNOSIS — Z7901 Long term (current) use of anticoagulants: Secondary | ICD-10-CM | POA: Diagnosis not present

## 2015-01-10 DIAGNOSIS — I482 Chronic atrial fibrillation: Secondary | ICD-10-CM | POA: Diagnosis not present

## 2015-02-24 DIAGNOSIS — I482 Chronic atrial fibrillation: Secondary | ICD-10-CM | POA: Diagnosis not present

## 2015-02-24 DIAGNOSIS — Z7901 Long term (current) use of anticoagulants: Secondary | ICD-10-CM | POA: Diagnosis not present

## 2015-03-24 DIAGNOSIS — Z7901 Long term (current) use of anticoagulants: Secondary | ICD-10-CM | POA: Diagnosis not present

## 2015-03-24 DIAGNOSIS — I482 Chronic atrial fibrillation: Secondary | ICD-10-CM | POA: Diagnosis not present

## 2015-04-21 DIAGNOSIS — I482 Chronic atrial fibrillation: Secondary | ICD-10-CM | POA: Diagnosis not present

## 2015-04-21 DIAGNOSIS — Z7901 Long term (current) use of anticoagulants: Secondary | ICD-10-CM | POA: Diagnosis not present

## 2015-05-05 DIAGNOSIS — I482 Chronic atrial fibrillation: Secondary | ICD-10-CM | POA: Diagnosis not present

## 2015-05-05 DIAGNOSIS — Z7901 Long term (current) use of anticoagulants: Secondary | ICD-10-CM | POA: Diagnosis not present

## 2015-05-23 ENCOUNTER — Ambulatory Visit (INDEPENDENT_AMBULATORY_CARE_PROVIDER_SITE_OTHER): Payer: Medicare Other | Admitting: Physician Assistant

## 2015-05-23 VITALS — BP 124/72 | HR 83 | Temp 98.4°F | Resp 18 | Ht 71.0 in | Wt 159.6 lb

## 2015-05-23 DIAGNOSIS — I2510 Atherosclerotic heart disease of native coronary artery without angina pectoris: Secondary | ICD-10-CM | POA: Diagnosis not present

## 2015-05-23 DIAGNOSIS — R220 Localized swelling, mass and lump, head: Secondary | ICD-10-CM | POA: Diagnosis not present

## 2015-05-23 DIAGNOSIS — R29810 Facial weakness: Secondary | ICD-10-CM | POA: Diagnosis not present

## 2015-05-23 DIAGNOSIS — K047 Periapical abscess without sinus: Secondary | ICD-10-CM | POA: Diagnosis not present

## 2015-05-23 LAB — POCT CBC
GRANULOCYTE PERCENT: 71.7 % (ref 37–80)
HCT, POC: 42.5 % — AB (ref 43.5–53.7)
HEMOGLOBIN: 13 g/dL — AB (ref 14.1–18.1)
Lymph, poc: 1.8 (ref 0.6–3.4)
MCH, POC: 28.9 pg (ref 27–31.2)
MCHC: 30.6 g/dL — AB (ref 31.8–35.4)
MCV: 94.2 fL (ref 80–97)
MID (cbc): 0.5 (ref 0–0.9)
MPV: 8.3 fL (ref 0–99.8)
PLATELET COUNT, POC: 174 10*3/uL (ref 142–424)
POC Granulocyte: 5.7 (ref 2–6.9)
POC LYMPH PERCENT: 22.5 %L (ref 10–50)
POC MID %: 5.8 %M (ref 0–12)
RBC: 4.51 M/uL — AB (ref 4.69–6.13)
RDW, POC: 16.2 %
WBC: 8 10*3/uL (ref 4.6–10.2)

## 2015-05-23 MED ORDER — AMOXICILLIN-POT CLAVULANATE 875-125 MG PO TABS: 1.0000 | ORAL_TABLET | Freq: Two times a day (BID) | ORAL | Status: AC

## 2015-05-23 MED ORDER — CEFTRIAXONE SODIUM 1 G IJ SOLR
1.0000 g | Freq: Once | INTRAMUSCULAR | Status: AC
  Administered 2015-05-23: 1 g via INTRAMUSCULAR

## 2015-05-23 NOTE — Patient Instructions (Addendum)
Take 1 tab augmentin twice a day until finished. Apply warm compresses/heating pad to facilitate drainage. You have an appointment with Dr. Deanna Artis tomorrow 10/4 at 11:30 AM. If in the meantime you develop fever, chills, night sweats, you need to go to the ED

## 2015-05-23 NOTE — Progress Notes (Signed)
[facility] [address] [place] [address] [phone]  Date:  05/23/2015   Name:  Craig Richard   DOB:  [birth year]   MRN:  [id number]  PCP:  [name], MD    Chief Complaint: Cyst   History of Present Illness:  This is a 81 y.o. male with PMH CAD, A fib, HTN, HLD who is presenting with a "cyst" under his right eye since this morning. States 2-3 weeks ago he had a root canal of a right upper molar. He was given abx after and finished these. 2 days ago he noticed an "ulcer" on his right upper gingiva. This was not particularly painful. He is able to chew on that side without pain. Last night he started having some discomfort in his right cheek. He was unable to sleep because of it. When he woke this morning he had significant right sided facial swelling and a red spot under his right eye. He denies fever, chills, weakness, numbness, confusion, difficulty with speech, unsteady gait. Dr. Deanna Artis is his regular dentist. Dr. Loyal Gambler is the oral surgeon who did his root canal. Pt lives alone.  Review of Systems:  Review of Systems See HPI  Patient Active Problem List   Diagnosis Date Noted  . CAD in native artery, wth hx ant MI, wth PTCA to LAD, 2010 06/09/2013  . Atrial fibrillation ([place])   . Hyperlipidemia   . Hypertension     Prior to Admission medications   Medication Sig Start Date End Date Taking? Authorizing Provider  atorvastatin (LIPITOR) 40 MG tablet Take 40 mg by mouth every other day.   Yes Historical Provider, MD  isosorbide mononitrate (IMDUR) 60 MG 24 hr tablet Take 60 mg by mouth daily before breakfast.   Yes Historical Provider, MD  metoprolol (LOPRESSOR) 50 MG tablet Take 75 mg by mouth 2 (two) times daily.   Yes Historical Provider, MD  ramipril (ALTACE) 10 MG capsule Take 10 mg by mouth daily.   Yes Historical Provider, MD  warfarin (COUMADIN) 5 MG tablet Take 2.5-5 mg by mouth daily. Takes as directed per INR 10/30/12  Yes  Arta Silence, MD  meclizine (ANTIVERT) 25 MG tablet Take 1 tablet by mouth as needed. 11/19/14   Historical Provider, MD  nitroGLYCERIN (NITROSTAT) 0.4 MG SL tablet Place 1 tablet (0.4 mg total) under the tongue every 5 (five) minutes as needed for chest pain. Patient not taking: Reported on 05/23/2015 06/07/14   Isaiah Serge, NP    No Known Allergies  Past Surgical History  Procedure Laterality Date  . Eye surgery Bilateral     cataract  . Esophagogastroduodenoscopy N/A 10/29/2012    Procedure: ESOPHAGOGASTRODUODENOSCOPY (EGD);  Surgeon: Arta Silence, MD;  Location: [facility];  Service: Endoscopy;  Laterality: N/A;  . Hot hemostasis N/A 10/29/2012    Procedure: HOT HEMOSTASIS (ARGON PLASMA COAGULATION/BICAP);  Surgeon: Arta Silence, MD;  Location: [facility];  Service: Endoscopy;  Laterality: N/A;  . Coronary angioplasty  09/17/2008    r/t acute anterior wall MI; apical LAD lesion  . Transthoracic echocardiogram  06/29/2010    EF=>55%, normal LV systolic function; LA & RA mod dilated; mild mitral annular calcif & mild MR; mod TR & elevated RV systolic pressure (mild pulm HTN); AV mildly sclerotic; mild pulm valve regurg  . Prostate surgery  2000  . Hemorrhoid surgery  2005    Social History  Substance Use Topics  . Smoking status: Never Smoker   .  Smokeless tobacco: Current User    Types: Chew     Comment: occasionally  . Alcohol Use: No    Family History  Problem Relation Age of Onset  . Stroke Mother   . Aneurysm Father     abdominal  . Cancer Maternal Grandmother   . Emphysema Sister   . Cancer Brother   . Heart disease Brother   . Cancer Sister     Medication list has been reviewed and updated.  Physical Examination:  Physical Exam  Constitutional: He is oriented to person, place, and time. He appears well-developed and well-nourished. No distress.  HENT:  Head: Normocephalic and atraumatic.  Right Ear: Hearing normal.  Left Ear: Hearing normal.   Nose: Nose normal.  Mouth/Throat: Uvula is midline, oropharynx is clear and moist and mucous membranes are normal.  Small pustule formation over 2nd to last upper right molar. Fluctuance present. Right sided facial swelling with induration and tenderness present under zygomatic process. There is a 3 cm erythematous soft nontender area under right eye. 5th nerve being affected, there is right sided facial droop.  Eyes: Conjunctivae and lids are normal. Right eye exhibits no discharge. Left eye exhibits no discharge. No scleral icterus.  Cardiovascular: Normal rate, regular rhythm and normal pulses.   Pulmonary/Chest: Effort normal and breath sounds normal. No respiratory distress.  Musculoskeletal: Normal range of motion.  Lymphadenopathy:       Head (right side): No submental, no submandibular, no tonsillar, no preauricular and no posterior auricular adenopathy present.       Head (left side): No submental, no submandibular, no tonsillar, no preauricular and no posterior auricular adenopathy present.    He has no cervical adenopathy.       Right: No supraclavicular adenopathy present.       Left: No supraclavicular adenopathy present.  Neurological: He is alert and oriented to person, place, and time. He has normal strength and normal reflexes. No sensory deficit. Gait normal.  Asymmetric smile and right sided facial droop, otherwise cranial nerves intact  Skin: Skin is warm, dry and intact.  Psychiatric: He has a normal mood and affect. His speech is normal and behavior is normal. Thought content normal.   BP 124/72 mmHg  Pulse 83  Temp(Src) 98.4 F (36.9 C) (Oral)  Resp 18  Ht 5\' 11"  (1.803 m)  Wt 159 lb 9.6 oz (72.394 kg)  BMI 22.27 kg/m2  SpO2 92%  Procedure note: Verbal consent obtained. An 18 gauge needle was used to puncture fluctuance area in right upper gingiva. A large amount of purulence expressed. Wound care discussed.  Results for orders placed or performed in visit on  05/23/15  POCT CBC  Result Value Ref Range   WBC 8.0 4.6 - 10.2 K/uL   Lymph, poc 1.8 0.6 - 3.4   POC LYMPH PERCENT 22.5 10 - 50 %L   MID (cbc) 0.5 0 - 0.9   POC MID % 5.8 0 - 12 %M   POC Granulocyte 5.7 2 - 6.9   Granulocyte percent 71.7 37 - 80 %G   RBC 4.51 (A) 4.69 - 6.13 M/uL   Hemoglobin 13.0 (A) 14.1 - 18.1 g/dL   HCT, POC 42.5 (A) 43.5 - 53.7 %   MCV 94.2 80 - 97 fL   MCH, POC 28.9 27 - 31.2 pg   MCHC 30.6 (A) 31.8 - 35.4 g/dL   RDW, POC 16.2 %   Platelet Count, POC 174 142 - 424 K/uL   MPV  8.3 0 - 99.8 fL   Assessment and Plan:  1. Dental abscess 2. Facial swelling 3. Facial droop Pt has a significant dental abscess that is possibly invading sinus cavity and is affecting 5th nerve d/t facial droop. Neuro exam otherwise normal. Abscess punctured with 18 gauge needle and large amount purulence expressed. Wound culture pending. Pt given 1 gm rocephin in office and will start augmentin BID x 10 days. Pt has appt tmrw with Dr. Deanna Artis at 11:30 AM for further evaluation and treatment. Do not think he needs emergent CT face and treatment today as CBC nml and not yet toxic. Advised if before his appt with Dr. Deanna Artis he develops fever, chills, night sweats -- he needs to go to the ED. Pt voiced understanding of the treatment plan. - POCT CBC - cefTRIAXone (ROCEPHIN) injection 1 g; Inject 1 g into the muscle once. - amoxicillin-clavulanate (AUGMENTIN) 875-125 MG tablet; Take 1 tablet by mouth 2 (two) times daily.  Dispense: 20 tablet; Refill: 0 - Wound culture  Discussed patient with Dr. Laney Pastor.  Benjaman Pott Drenda Freeze, MHS [facility]  05/24/2015

## 2015-05-26 LAB — WOUND CULTURE
GRAM STAIN: NONE SEEN
Organism ID, Bacteria: NORMAL

## 2015-05-27 NOTE — Progress Notes (Signed)
   Subjective:    Patient ID: Craig Richard, male    DOB: Mar 05, 1922, 81 y.o.   MRN: [id number]  HPIAsked to see this medicare patient by [facility] 81[facility] male with multiple illnesses Patient Active Problem List   Diagnosis Date Noted  . CAD in native artery, wth hx ant MI, wth PTCA to LAD, 2010 06/09/2013  . Atrial fibrillation ([place])   . Hyperlipidemia   . Hypertension    Who presents with swelling R cheek and pain r upper gumline. 2d ago had root canal #5 R Upper No fever ,chills, N,V, Injury. Unable to sleep due to pain last night.  [place]DS/[place] reg DDS  81 yo lives alone/cares for self C/o how much the dentist costs!!!  Review of Systems noncontr Has been able to eat and drink Heart issues not compromising this    Objective:   Physical Exam BP 124/72 mmHg  Pulse 83  Temp(Src) 98.4 F (36.9 C) (Oral)  Resp 18  Ht 5\' 11"  (1.803 m)  Wt 159 lb 9.6 oz (72.394 kg)  BMI 22.27 kg/m2  SpO2 92% Diffusely swollen R malar/tender over R maxilla Eye uninvolved Intraoral shows swelling with fluctuance and tenderness gum at #3, 2 teeth posterior to root canal tooth Other teeth/gums unaffected Soft palate wnl No cerv adenopathy He is oriented/well spoken w/ no cog defects CN2-12 intact except for droop of R corner of mouth but rest of 7th n intact  Procedure; With 18g needle I opened a linear tear at the root of #3 in the area of fluctuance and drained a copious amount of pus via milking from the malar area(approx 8-10cc) He handled this well/less visible swelling  Due to rapidity of this process we gave him rocephin 1g in the office    Assessment & Plan:  Dental abscess - Plan: POCT CBC, cefTRIAXone (ROCEPHIN) injection 1 g - amoxicillin-clavulanate (AUGMENTIN) 875-125 MG tablet bid - Wound culture - we will call his dentist to see him in f/u tomorrow  Facial swelling due to abscess  -  Hot compresses to encourage draining  Facial droop due to swelling - f/u Dr  Rogelia Mire pcp for reck cranial nerves

## 2015-05-30 DIAGNOSIS — I2510 Atherosclerotic heart disease of native coronary artery without angina pectoris: Secondary | ICD-10-CM | POA: Diagnosis not present

## 2015-05-30 DIAGNOSIS — E782 Mixed hyperlipidemia: Secondary | ICD-10-CM | POA: Diagnosis not present

## 2015-05-30 DIAGNOSIS — Z7901 Long term (current) use of anticoagulants: Secondary | ICD-10-CM | POA: Diagnosis not present

## 2015-05-30 DIAGNOSIS — I482 Chronic atrial fibrillation: Secondary | ICD-10-CM | POA: Diagnosis not present

## 2015-05-30 DIAGNOSIS — I10 Essential (primary) hypertension: Secondary | ICD-10-CM | POA: Diagnosis not present

## 2015-05-30 DIAGNOSIS — Z23 Encounter for immunization: Secondary | ICD-10-CM | POA: Diagnosis not present

## 2015-05-30 DIAGNOSIS — E1121 Type 2 diabetes mellitus with diabetic nephropathy: Secondary | ICD-10-CM | POA: Diagnosis not present

## 2015-06-03 DIAGNOSIS — I10 Essential (primary) hypertension: Secondary | ICD-10-CM | POA: Diagnosis not present

## 2015-06-03 DIAGNOSIS — E119 Type 2 diabetes mellitus without complications: Secondary | ICD-10-CM | POA: Diagnosis not present

## 2015-06-03 DIAGNOSIS — E1121 Type 2 diabetes mellitus with diabetic nephropathy: Secondary | ICD-10-CM | POA: Diagnosis not present

## 2015-06-03 DIAGNOSIS — E782 Mixed hyperlipidemia: Secondary | ICD-10-CM | POA: Diagnosis not present

## 2015-06-27 DIAGNOSIS — I482 Chronic atrial fibrillation: Secondary | ICD-10-CM | POA: Diagnosis not present

## 2015-06-27 DIAGNOSIS — Z7901 Long term (current) use of anticoagulants: Secondary | ICD-10-CM | POA: Diagnosis not present

## 2015-07-11 DIAGNOSIS — I482 Chronic atrial fibrillation: Secondary | ICD-10-CM | POA: Diagnosis not present

## 2015-07-11 DIAGNOSIS — Z7901 Long term (current) use of anticoagulants: Secondary | ICD-10-CM | POA: Diagnosis not present

## 2015-08-08 DIAGNOSIS — I482 Chronic atrial fibrillation: Secondary | ICD-10-CM | POA: Diagnosis not present

## 2015-08-08 DIAGNOSIS — Z7901 Long term (current) use of anticoagulants: Secondary | ICD-10-CM | POA: Diagnosis not present

## 2015-08-25 DIAGNOSIS — N433 Hydrocele, unspecified: Secondary | ICD-10-CM | POA: Diagnosis not present

## 2015-09-05 DIAGNOSIS — Z7901 Long term (current) use of anticoagulants: Secondary | ICD-10-CM | POA: Diagnosis not present

## 2015-09-05 DIAGNOSIS — I482 Chronic atrial fibrillation: Secondary | ICD-10-CM | POA: Diagnosis not present

## 2015-10-06 DIAGNOSIS — Z7901 Long term (current) use of anticoagulants: Secondary | ICD-10-CM | POA: Diagnosis not present

## 2015-10-06 DIAGNOSIS — I482 Chronic atrial fibrillation: Secondary | ICD-10-CM | POA: Diagnosis not present

## 2015-11-07 DIAGNOSIS — Z7901 Long term (current) use of anticoagulants: Secondary | ICD-10-CM | POA: Diagnosis not present

## 2015-11-07 DIAGNOSIS — I482 Chronic atrial fibrillation: Secondary | ICD-10-CM | POA: Diagnosis not present

## 2015-11-22 DIAGNOSIS — Z0100 Encounter for examination of eyes and vision without abnormal findings: Secondary | ICD-10-CM | POA: Diagnosis not present

## 2015-11-22 DIAGNOSIS — Z961 Presence of intraocular lens: Secondary | ICD-10-CM | POA: Diagnosis not present

## 2015-12-06 DIAGNOSIS — I482 Chronic atrial fibrillation: Secondary | ICD-10-CM | POA: Diagnosis not present

## 2015-12-06 DIAGNOSIS — Z7901 Long term (current) use of anticoagulants: Secondary | ICD-10-CM | POA: Diagnosis not present

## 2015-12-09 DIAGNOSIS — I10 Essential (primary) hypertension: Secondary | ICD-10-CM | POA: Diagnosis not present

## 2015-12-09 DIAGNOSIS — E119 Type 2 diabetes mellitus without complications: Secondary | ICD-10-CM | POA: Diagnosis not present

## 2015-12-09 DIAGNOSIS — E782 Mixed hyperlipidemia: Secondary | ICD-10-CM | POA: Diagnosis not present

## 2015-12-09 DIAGNOSIS — E1121 Type 2 diabetes mellitus with diabetic nephropathy: Secondary | ICD-10-CM | POA: Diagnosis not present

## 2015-12-13 ENCOUNTER — Ambulatory Visit (INDEPENDENT_AMBULATORY_CARE_PROVIDER_SITE_OTHER): Payer: Medicare Other | Admitting: Cardiovascular Disease

## 2015-12-13 ENCOUNTER — Encounter: Payer: Self-pay | Admitting: Cardiovascular Disease

## 2015-12-13 DIAGNOSIS — I482 Chronic atrial fibrillation, unspecified: Secondary | ICD-10-CM

## 2015-12-13 NOTE — Assessment & Plan Note (Signed)
History of hyperlipidemia on statin therapy followed by his PCP 

## 2015-12-13 NOTE — Assessment & Plan Note (Signed)
History of CAD status post acute anterior wall myocardial infarction 09/17/08 at 7:30 morning. I brought him to the cath lab and apical LAD lesion was angioplastied. At that time his EF was 40% with apical dyskinesia. Subsequent echo showed normalization of his ejection fraction. Myoview stress test showed apical scar. He denies chest pain or shortness of breath.

## 2015-12-13 NOTE — Assessment & Plan Note (Signed)
History of hypertension blood pressure measurements A1 12/80 He is on metoprolol and ramipril. Continue current meds at current dosing

## 2015-12-13 NOTE — Assessment & Plan Note (Signed)
Chronic active fibrillation rate controlled on Coumadin anticoagulation.

## 2015-12-13 NOTE — Progress Notes (Signed)
12/13/2015 Craig Richard   01/03/1922  [id number]  Primary Physician [name], MD Primary Cardiologist: Lorretta Harp MD Renae Gloss   HPI:  The patient is a very pleasant, 82 year old, thin-appearing, widowed Caucasian male, father of 2 daughters, grandfather to 4 grandchildren who I last saw in the office /20/16.Marland Kitchen His wife of 4 years died in 2009-09-17. He lives alone but his children are close by. He was formerly a patient of Dr. Roe Rutherford. He has a history of remote tobacco abuse as well as treated hypertension and hyperlipidemia. He has chronic A-fib well controlled on Coumadin anticoagulation which is checked by Elliot Gault at [facility]. He presented September 17, 2008, 7:30 in the morning with an acute anterior wall myocardial infarction. I catheterized him and ultimately found an apical LAD lesion which I angioplastied. At that time, he had apical dyskinesia with an EF of 40% but a followup echo performed June 29, 2010, was entirely normal and a Myoview stress test showed only a small apical scar. He is otherwise asymptomatic. His primary care physician follows his lipid profile.   Current Outpatient Prescriptions  Medication Sig Dispense Refill  . atorvastatin (LIPITOR) 40 MG tablet Take 40 mg by mouth every other day.    . isosorbide mononitrate (IMDUR) 60 MG 24 hr tablet Take 60 mg by mouth daily before breakfast.    . meclizine (ANTIVERT) 25 MG tablet Take 1 tablet by mouth as needed.  6  . metoprolol (LOPRESSOR) 50 MG tablet Take 75 mg by mouth 2 (two) times daily.    . nitroGLYCERIN (NITROSTAT) 0.4 MG SL tablet Place 1 tablet (0.4 mg total) under the tongue every 5 (five) minutes as needed for chest pain. 25 tablet 6  . ramipril (ALTACE) 10 MG capsule Take 10 mg by mouth daily.    Marland Kitchen warfarin (COUMADIN) 5 MG tablet Take 2.5-5 mg by mouth daily. Takes as directed per INR     No current facility-administered medications for this visit.     No Known Allergies  Social History   Social History  . Marital Status: Widowed    Spouse Name: N/A  . Number of Children: 2  . Years of Education: B.S. [employer]   Occupational History  .     Social History Main Topics  . Smoking status: Never Smoker   . Smokeless tobacco: Current User    Types: Chew     Comment: occasionally  . Alcohol Use: No  . Drug Use: Not on file  . Sexual Activity: Not on file   Other Topics Concern  . Not on file   Social History Narrative     Review of Systems: General: negative for chills, fever, night sweats or weight changes.  Cardiovascular: negative for chest pain, dyspnea on exertion, edema, orthopnea, palpitations, paroxysmal nocturnal dyspnea or shortness of breath Dermatological: negative for rash Respiratory: negative for cough or wheezing Urologic: negative for hematuria Abdominal: negative for nausea, vomiting, diarrhea, bright red blood per rectum, melena, or hematemesis Neurologic: negative for visual changes, syncope, or dizziness All other systems reviewed and are otherwise negative except as noted above.    Blood pressure 112/80, pulse 77, height 5\' 10"  (1.778 m), weight 161 lb (73.029 kg).  General appearance: alert and no distress Neck: no adenopathy, no carotid bruit, no JVD, supple, symmetrical, trachea midline and thyroid not enlarged, symmetric, no tenderness/mass/nodules Lungs: clear to auscultation bilaterally Heart: irregularly irregular rhythm Extremities: extremities normal, atraumatic, no cyanosis or edema  EKG atrial fibrillation with a ventricular response of 77 and nonspecific ST and T-wave changes. I personally reviewed this EKG  ASSESSMENT AND PLAN:   CAD in native artery, wth hx ant MI, wth PTCA to LAD, 2010 History of CAD status post acute anterior wall myocardial infarction 09/17/08 at 7:30 morning. I brought him to the cath lab and apical LAD lesion was angioplastied. At that time his EF was 40% with  apical dyskinesia. Subsequent echo showed normalization of his ejection fraction. Myoview stress test showed apical scar. He denies chest pain or shortness of breath.  Atrial fibrillation Chronic active fibrillation rate controlled on Coumadin anticoagulation.  Hyperlipidemia History of hyperlipidemia on statin therapy followed by his PCP  Hypertension History of hypertension blood pressure measurements A1 12/80 He is on metoprolol and ramipril. Continue current meds at current dosing      Lorretta Harp MD [facility] 12/13/2015 4:06 PM

## 2015-12-13 NOTE — Patient Instructions (Signed)
Your physician recommends that you continue on your current medications as directed. Please refer to the Current Medication list given to you today.   Your physician wants you to follow-up in: 1 year with Dr Gwenlyn Found. You will receive a reminder letter in the mail two months in advance. If you don't receive a letter, please call our office to schedule the follow-up appointment.

## 2015-12-16 DIAGNOSIS — E782 Mixed hyperlipidemia: Secondary | ICD-10-CM | POA: Diagnosis not present

## 2015-12-16 DIAGNOSIS — E119 Type 2 diabetes mellitus without complications: Secondary | ICD-10-CM | POA: Diagnosis not present

## 2015-12-16 DIAGNOSIS — I10 Essential (primary) hypertension: Secondary | ICD-10-CM | POA: Diagnosis not present

## 2015-12-16 DIAGNOSIS — E1121 Type 2 diabetes mellitus with diabetic nephropathy: Secondary | ICD-10-CM | POA: Diagnosis not present

## 2016-01-05 DIAGNOSIS — Z7901 Long term (current) use of anticoagulants: Secondary | ICD-10-CM | POA: Diagnosis not present

## 2016-01-05 DIAGNOSIS — I482 Chronic atrial fibrillation: Secondary | ICD-10-CM | POA: Diagnosis not present

## 2016-02-01 DIAGNOSIS — I482 Chronic atrial fibrillation: Secondary | ICD-10-CM | POA: Diagnosis not present

## 2016-02-14 DIAGNOSIS — Z7901 Long term (current) use of anticoagulants: Secondary | ICD-10-CM | POA: Diagnosis not present

## 2016-02-14 DIAGNOSIS — I482 Chronic atrial fibrillation: Secondary | ICD-10-CM | POA: Diagnosis not present

## 2016-02-15 ENCOUNTER — Inpatient Hospital Stay (HOSPITAL_COMMUNITY)
Admission: EM | Admit: 2016-02-15 | Discharge: 2016-02-19 | DRG: 064 | Disposition: A | Discharge status: Home Health | Payer: Medicare Other | Attending: Internal Medicine | Admitting: Internal Medicine

## 2016-02-15 ENCOUNTER — Emergency Department (HOSPITAL_COMMUNITY): Payer: Medicare Other

## 2016-02-15 ENCOUNTER — Encounter (HOSPITAL_COMMUNITY): Payer: Self-pay

## 2016-02-15 DIAGNOSIS — R404 Transient alteration of awareness: Secondary | ICD-10-CM | POA: Diagnosis present

## 2016-02-15 DIAGNOSIS — I10 Essential (primary) hypertension: Secondary | ICD-10-CM | POA: Diagnosis not present

## 2016-02-15 DIAGNOSIS — Z7901 Long term (current) use of anticoagulants: Secondary | ICD-10-CM

## 2016-02-15 DIAGNOSIS — E538 Deficiency of other specified B group vitamins: Secondary | ICD-10-CM | POA: Diagnosis present

## 2016-02-15 DIAGNOSIS — E785 Hyperlipidemia, unspecified: Secondary | ICD-10-CM | POA: Diagnosis present

## 2016-02-15 DIAGNOSIS — I708 Atherosclerosis of other arteries: Secondary | ICD-10-CM | POA: Clinically undetermined

## 2016-02-15 DIAGNOSIS — R410 Disorientation, unspecified: Secondary | ICD-10-CM | POA: Diagnosis not present

## 2016-02-15 DIAGNOSIS — I2510 Atherosclerotic heart disease of native coronary artery without angina pectoris: Secondary | ICD-10-CM | POA: Diagnosis present

## 2016-02-15 DIAGNOSIS — T1590XA Foreign body on external eye, part unspecified, unspecified eye, initial encounter: Secondary | ICD-10-CM

## 2016-02-15 DIAGNOSIS — Z79899 Other long term (current) drug therapy: Secondary | ICD-10-CM

## 2016-02-15 DIAGNOSIS — I6523 Occlusion and stenosis of bilateral carotid arteries: Secondary | ICD-10-CM | POA: Diagnosis present

## 2016-02-15 DIAGNOSIS — I4891 Unspecified atrial fibrillation: Secondary | ICD-10-CM | POA: Diagnosis not present

## 2016-02-15 DIAGNOSIS — I252 Old myocardial infarction: Secondary | ICD-10-CM

## 2016-02-15 DIAGNOSIS — Z9861 Coronary angioplasty status: Secondary | ICD-10-CM

## 2016-02-15 DIAGNOSIS — E876 Hypokalemia: Secondary | ICD-10-CM | POA: Diagnosis present

## 2016-02-15 DIAGNOSIS — G9341 Metabolic encephalopathy: Secondary | ICD-10-CM | POA: Diagnosis not present

## 2016-02-15 DIAGNOSIS — I639 Cerebral infarction, unspecified: Principal | ICD-10-CM | POA: Diagnosis present

## 2016-02-15 DIAGNOSIS — R4182 Altered mental status, unspecified: Secondary | ICD-10-CM | POA: Diagnosis not present

## 2016-02-15 DIAGNOSIS — I517 Cardiomegaly: Secondary | ICD-10-CM | POA: Diagnosis not present

## 2016-02-15 DIAGNOSIS — Z66 Do not resuscitate: Secondary | ICD-10-CM | POA: Diagnosis present

## 2016-02-15 DIAGNOSIS — I748 Embolism and thrombosis of other arteries: Secondary | ICD-10-CM | POA: Diagnosis not present

## 2016-02-15 DIAGNOSIS — S0990XA Unspecified injury of head, initial encounter: Secondary | ICD-10-CM | POA: Diagnosis not present

## 2016-02-15 DIAGNOSIS — W01198A Fall on same level from slipping, tripping and stumbling with subsequent striking against other object, initial encounter: Secondary | ICD-10-CM | POA: Diagnosis present

## 2016-02-15 DIAGNOSIS — G9340 Encephalopathy, unspecified: Secondary | ICD-10-CM

## 2016-02-15 DIAGNOSIS — R402411 Glasgow coma scale score 13-15, in the field [EMT or ambulance]: Secondary | ICD-10-CM | POA: Diagnosis not present

## 2016-02-15 DIAGNOSIS — F17220 Nicotine dependence, chewing tobacco, uncomplicated: Secondary | ICD-10-CM | POA: Diagnosis present

## 2016-02-15 DIAGNOSIS — Y92002 Bathroom of unspecified non-institutional (private) residence single-family (private) house as the place of occurrence of the external cause: Secondary | ICD-10-CM

## 2016-02-15 LAB — COMPREHENSIVE METABOLIC PANEL
ALBUMIN: 4 g/dL (ref 3.5–5.0)
ALK PHOS: 73 U/L (ref 38–126)
ALT: 8 U/L — AB (ref 17–63)
ANION GAP: 10 (ref 5–15)
AST: 15 U/L (ref 15–41)
BUN: 19 mg/dL (ref 6–20)
CALCIUM: 8.6 mg/dL — AB (ref 8.9–10.3)
CHLORIDE: 103 mmol/L (ref 101–111)
CO2: 23 mmol/L (ref 22–32)
CREATININE: 1.34 mg/dL — AB (ref 0.61–1.24)
GFR calc Af Amer: 51 mL/min — ABNORMAL LOW (ref >60)
GFR calc non Af Amer: 44 mL/min — ABNORMAL LOW (ref >60)
GLUCOSE: 94 mg/dL (ref 65–99)
Potassium: 3.2 mmol/L — ABNORMAL LOW (ref 3.5–5.1)
SODIUM: 136 mmol/L (ref 135–145)
Total Bilirubin: 1.2 mg/dL (ref 0.3–1.2)
Total Protein: 6.5 g/dL (ref 6.5–8.1)

## 2016-02-15 LAB — URINE MICROSCOPIC-ADD ON

## 2016-02-15 LAB — CBC WITH DIFFERENTIAL/PLATELET
BASOS ABS: 0 10*3/uL (ref 0.0–0.1)
BASOS PCT: 0 %
EOS ABS: 0 10*3/uL (ref 0.0–0.7)
Eosinophils Relative: 0 %
HCT: 34.3 % — ABNORMAL LOW (ref 39.0–52.0)
HEMOGLOBIN: 11.3 g/dL — AB (ref 13.0–17.0)
LYMPHS ABS: 0.5 10*3/uL — AB (ref 0.7–4.0)
Lymphocytes Relative: 9 %
MCH: 30.5 pg (ref 26.0–34.0)
MCHC: 32.9 g/dL (ref 30.0–36.0)
MCV: 92.5 fL (ref 78.0–100.0)
Monocytes Absolute: 0.8 10*3/uL (ref 0.1–1.0)
Monocytes Relative: 14 %
NEUTROS PCT: 77 %
Neutro Abs: 4.6 10*3/uL (ref 1.7–7.7)
Platelets: 202 10*3/uL (ref 150–400)
RBC: 3.71 MIL/uL — AB (ref 4.22–5.81)
RDW: 15.1 % (ref 11.5–15.5)
WBC: 5.9 10*3/uL (ref 4.0–10.5)

## 2016-02-15 LAB — URINALYSIS, ROUTINE W REFLEX MICROSCOPIC
Bilirubin Urine: NEGATIVE
Glucose, UA: NEGATIVE mg/dL
Ketones, ur: 15 mg/dL — AB
Leukocytes, UA: NEGATIVE
NITRITE: NEGATIVE
PROTEIN: 30 mg/dL — AB
SPECIFIC GRAVITY, URINE: 1.015 (ref 1.005–1.030)
pH: 7 (ref 5.0–8.0)

## 2016-02-15 LAB — T4, FREE: Free T4: 0.87 ng/dL (ref 0.61–1.12)

## 2016-02-15 LAB — PROTIME-INR
INR: 1.88 — ABNORMAL HIGH (ref 0.00–1.49)
Prothrombin Time: 20.9 seconds — ABNORMAL HIGH (ref 11.6–15.2)

## 2016-02-15 LAB — TSH: TSH: 1.955 u[IU]/mL (ref 0.350–4.500)

## 2016-02-15 MED ORDER — WARFARIN SODIUM 2.5 MG PO TABS: 2.5000 mg | ORAL_TABLET | Freq: Every day | ORAL | Status: DC

## 2016-02-15 MED ORDER — NITROGLYCERIN 0.4 MG SL SUBL: 0.4000 mg | SUBLINGUAL_TABLET | SUBLINGUAL | Status: DC | PRN

## 2016-02-15 MED ORDER — ACETAMINOPHEN 650 MG RE SUPP: 650.0000 mg | Freq: Four times a day (QID) | RECTAL | Status: DC | PRN

## 2016-02-15 MED ORDER — POTASSIUM CHLORIDE CRYS ER 20 MEQ PO TBCR
40.0000 meq | EXTENDED_RELEASE_TABLET | Freq: Three times a day (TID) | ORAL | Status: AC
  Administered 2016-02-15 (×2): 40 meq via ORAL
  Filled 2016-02-15 (×2): qty 2

## 2016-02-15 MED ORDER — ATORVASTATIN CALCIUM 40 MG PO TABS
40.0000 mg | ORAL_TABLET | ORAL | Status: DC
  Administered 2016-02-16: 40 mg via ORAL
  Filled 2016-02-15: qty 1

## 2016-02-15 MED ORDER — METOPROLOL TARTRATE 50 MG PO TABS: 75.0000 mg | ORAL_TABLET | Freq: Two times a day (BID) | ORAL | Status: DC

## 2016-02-15 MED ORDER — ONDANSETRON HCL 4 MG/2ML IJ SOLN: 4.0000 mg | Freq: Three times a day (TID) | INTRAMUSCULAR | Status: AC | PRN

## 2016-02-15 MED ORDER — SODIUM CHLORIDE 0.9 % IV SOLN
INTRAVENOUS | Status: AC
  Administered 2016-02-15: 11:00:00 via INTRAVENOUS

## 2016-02-15 MED ORDER — METOPROLOL TARTRATE 50 MG PO TABS
75.0000 mg | ORAL_TABLET | Freq: Two times a day (BID) | ORAL | Status: DC
  Administered 2016-02-15 – 2016-02-19 (×7): 75 mg via ORAL
  Filled 2016-02-15 (×9): qty 1

## 2016-02-15 MED ORDER — ADULT MULTIVITAMIN W/MINERALS CH
1.0000 | ORAL_TABLET | Freq: Every day | ORAL | Status: DC
  Administered 2016-02-15 – 2016-02-19 (×5): 1 via ORAL
  Filled 2016-02-15 (×6): qty 1

## 2016-02-15 MED ORDER — ACETAMINOPHEN 325 MG PO TABS: 650.0000 mg | ORAL_TABLET | Freq: Four times a day (QID) | ORAL | Status: DC | PRN

## 2016-02-15 MED ORDER — ISOSORBIDE MONONITRATE ER 30 MG PO TB24
60.0000 mg | ORAL_TABLET | Freq: Every day | ORAL | Status: DC
  Administered 2016-02-16 – 2016-02-19 (×4): 60 mg via ORAL
  Filled 2016-02-15: qty 2
  Filled 2016-02-15 (×2): qty 1
  Filled 2016-02-15 (×2): qty 2

## 2016-02-15 MED ORDER — WARFARIN - PHARMACIST DOSING INPATIENT: Freq: Every day | Status: DC

## 2016-02-15 MED ORDER — ENSURE ENLIVE PO LIQD
237.0000 mL | Freq: Two times a day (BID) | ORAL | Status: DC
  Administered 2016-02-16 – 2016-02-19 (×5): 237 mL via ORAL

## 2016-02-15 MED ORDER — WARFARIN SODIUM 2.5 MG PO TABS
2.5000 mg | ORAL_TABLET | Freq: Once | ORAL | Status: AC
  Administered 2016-02-15: 2.5 mg via ORAL
  Filled 2016-02-15: qty 1

## 2016-02-15 MED ORDER — RAMIPRIL 10 MG PO CAPS
10.0000 mg | ORAL_CAPSULE | Freq: Every day | ORAL | Status: DC
Start: 2016-02-15 — End: 2016-02-16
  Administered 2016-02-15 – 2016-02-16 (×2): 10 mg via ORAL
  Filled 2016-02-15 (×3): qty 1

## 2016-02-15 NOTE — ED Notes (Signed)
Per EMS- Patient lives alone. Patient's daughter visits him daily. Today, the daughter found the patient sleeping on the floor in the bedroom. Patient had urinated in a dresser drawer and while she was there  The patient crawled to the bathroom and fell against the tub. Patient's daughter states that the patient is normally confused to day and date only, but today confusion is much worse. Patient c/o feeling tired. Patient had atrial fib on the monitor and has a history of the same. Patient is also on Coumadin.

## 2016-02-15 NOTE — H&P (Addendum)
History and Physical  Craig Richard [id number] DOB: [birth year] DOA: 02/15/2016  PCP: [name], MD   Patient coming from: Home, EMS called by daughter.   Chief Complaint: Confusion   HPI: BRICYN FOK is a 82 y.o. male with medical history significant for Afib on coumadin, and prior CAD who is being admitted due to delirium. Pt's daughter is present in ER and provided history, he is very independent, lives alone, cooks, performs own ADLs and still drives. He went to coumadin clinic yesterday and was totally fine. This AM daughter came to his house and found him asleep on the floor and saw that he had urinated in a dresser drawer. He was very confused, was able to walk to bathroom and then fell, hitting his upper back but not his head. She called EMS and he was brought here. She denies any recent fevers, chills, cough, or any other complaints. Pt is sleepy but arousable, oriented only to self.   ED Course: Head CT unremarkable, CXR unrevealing, UA not infected appearing. [facility] asked to admit for observation and further care.  Review of Systems: Please see HPI for pertinent positives and negatives. A complete 10 system review of systems are otherwise negative.  Past Medical History  Diagnosis Date  . Atrial fibrillation ([place])     coumadin - follow by Reinaldo Meeker at [facility]  . Hyperlipidemia   . Hypertension   . History of anterior wall myocardial infarction 09/17/2008  . History of nuclear stress test 06/14/2010    bruce protocol myoview; stress images show perfusion defect in apical wall (scar)  . CAD (coronary artery disease)    Past Surgical History  Procedure Laterality Date  . Eye surgery Bilateral     cataract  . Esophagogastroduodenoscopy N/A 10/29/2012    Procedure: ESOPHAGOGASTRODUODENOSCOPY (EGD);  Surgeon: Arta Silence, MD;  Location: [facility];  Service: Endoscopy;  Laterality: N/A;  . Hot hemostasis N/A 10/29/2012    Procedure: HOT HEMOSTASIS  (ARGON PLASMA COAGULATION/BICAP);  Surgeon: Arta Silence, MD;  Location: [facility];  Service: Endoscopy;  Laterality: N/A;  . Coronary angioplasty  09/17/2008    r/t acute anterior wall MI; apical LAD lesion  . Transthoracic echocardiogram  06/29/2010    EF=>55%, normal LV systolic function; LA & RA mod dilated; mild mitral annular calcif & mild MR; mod TR & elevated RV systolic pressure (mild pulm HTN); AV mildly sclerotic; mild pulm valve regurg  . Prostate surgery  2000  . Hemorrhoid surgery  2005   Social History:  reports that he has never smoked. His smokeless tobacco use includes Chew. He reports that he does not drink alcohol or use illicit drugs.  No Known Allergies  Family History  Problem Relation Age of Onset  . Stroke Mother   . Aneurysm Father     abdominal  . Cancer Maternal Grandmother   . Emphysema Sister   . Cancer Brother   . Heart disease Brother   . Cancer Sister      Prior to Admission medications   Medication Sig Start Date End Date Taking? Authorizing Provider  atorvastatin (LIPITOR) 40 MG tablet Take 40 mg by mouth every other day.   Yes Historical Provider, MD  diphenhydrAMINE (BENADRYL) 25 MG tablet Take 25 mg by mouth every 6 (six) hours as needed for itching or allergies.   Yes Historical Provider, MD  isosorbide mononitrate (IMDUR) 60 MG 24 hr tablet Take 60 mg by mouth daily before breakfast.   Yes Historical Provider,  MD  metoprolol (LOPRESSOR) 50 MG tablet Take 75 mg by mouth 2 (two) times daily.   Yes Historical Provider, MD  Multiple Vitamin (MULTIVITAMIN WITH MINERALS) TABS tablet Take 1 tablet by mouth daily.   Yes Historical Provider, MD  ramipril (ALTACE) 10 MG capsule Take 10 mg by mouth daily.   Yes Historical Provider, MD  nitroGLYCERIN (NITROSTAT) 0.4 MG SL tablet Place 1 tablet (0.4 mg total) under the tongue every 5 (five) minutes as needed for chest pain. 06/07/14   Isaiah Serge, NP  warfarin (COUMADIN) 5 MG tablet Take 2.5-5 mg by  mouth daily. Take 1/2 tablet on Monday Wednesday  Friday and Saturday and 1 tablet daily the rest of the week. 10/30/12   Arta Silence, MD    Physical Exam: BP 116/85 mmHg  Pulse 84  Temp(Src) 98.3 F (36.8 C) (Rectal)  Resp 19  SpO2 99%  General:  Elderly male appearing his stated age, sleeping. Arousable, answers questions but does not know where he is, what year it is, etc. Eyes: pupils round and reactive to light and accomodation, clear sclerea Neck: supple, no masses, trachea mildline  Cardiovascular: RRR, no murmurs or rubs, no peripheral edema  Respiratory: clear to auscultation bilaterally, no wheezes, no crackles  Abdomen: soft, nontender, nondistended, normal bowel tones heard  Skin: dry, no rashes, he has a few bruises on his arms but no bony tenderness Musculoskeletal: no joint effusions, normal range of motion  Psychiatric: appropriate affect, normal speech  Neurologic: extraocular muscles intact, clear speech, moving all extremities with intact sensorium           Labs on Admission:  Basic Metabolic Panel:  Recent Labs Lab 02/15/16 1152  NA 136  K 3.2*  CL 103  CO2 23  GLUCOSE 94  BUN 19  CREATININE 1.34*  CALCIUM 8.6*   Liver Function Tests:  Recent Labs Lab 02/15/16 1152  AST 15  ALT 8*  ALKPHOS 73  BILITOT 1.2  PROT 6.5  ALBUMIN 4.0   No results for input(s): LIPASE, AMYLASE in the last 168 hours. No results for input(s): AMMONIA in the last 168 hours. CBC:  Recent Labs Lab 02/15/16 1108  WBC 5.9  NEUTROABS 4.6  HGB 11.3*  HCT 34.3*  MCV 92.5  PLT 202   Cardiac Enzymes: No results for input(s): CKTOTAL, CKMB, CKMBINDEX, TROPONINI in the last 168 hours.  BNP (last 3 results) No results for input(s): BNP in the last 8760 hours.  ProBNP (last 3 results) No results for input(s): PROBNP in the last 8760 hours.  CBG: No results for input(s): GLUCAP in the last 168 hours.  Radiological Exams on Admission: Dg Chest 2  View  02/15/2016  CLINICAL DATA:  Worsening confusion.  Altered mental status. EXAM: CHEST  2 VIEW COMPARISON:  10/23/2013 FINDINGS: Mild cardiomegaly. There is prominence of soft tissue in the right paratracheal region, not definitively seen on prior study. No focal airspace opacities or edema. No effusions or acute bony abnormality. IMPRESSION: Mild cardiomegaly. Mild prominence of right paratracheal soft tissue. I favor this is vascular or possibly related to goiter. Mediastinal adenopathy cannot be completely excluded. This could be further evaluated with chest CT with IV contrast if felt clinically indicated. Electronically Signed   By: Rolm Baptise M.D.   On: 02/15/2016 11:42   Ct Head Wo Contrast  02/15/2016  CLINICAL DATA:  Recent fall with altered level of consciousness EXAM: CT HEAD WITHOUT CONTRAST TECHNIQUE: Contiguous axial images were obtained from  the base of the skull through the vertex without intravenous contrast. COMPARISON:  07/12/2004 FINDINGS: Several motion artifact is identified limiting the exam. Mild atrophic changes are noted. No findings to suggest acute hemorrhage, acute infarction or space-occupying mass lesion are noted. IMPRESSION: Mild atrophic changes without acute abnormality. Electronically Signed   By: Inez Catalina M.D.   On: 02/15/2016 11:53    Assessment/Plan Present on Admission:  . Altered level of consciousness . Delirium Active Problems:   Delirium - at this point etiology unclear. No s/s of infection. CXR and UA clear. No new medications, no new injury. CT Head without bleed or stroke. - admit obs - supportive care - PT/OT eval    CAD in native artery, wth hx ant MI, wth PTCA to LAD, 2010 - cont cardiac meds   Atrial fibrillation (HCC) - rate controlled, cont beta blocker - cont Warfarin for now, but unless mental status returns to baseline quickly may be prudent to stop AC due to fall risk. Discussed with daughter and she understands   Altered level  of consciousness  HypoK - replete orally and recheck in AM.  DVT prophylaxis: On warfarin.   Code Status: FULL   Family Communication: D/w daughter in ER.   Disposition Plan: Likely to home with [facility].   Consults called: None   Admission status: Obs   Time spent: 39 minutes  Marisol Glazer Marry Guan MD [facility] Pager [phone]  If 7PM-7AM, please contact night-coverage [email or web address] Password [email or web address]  02/15/2016, 3:11 PM

## 2016-02-15 NOTE — ED Notes (Signed)
Bed: WA17 Expected date:  Expected time:  Means of arrival:  Comments: EMS- 82yo M, AMS/confusion

## 2016-02-15 NOTE — Progress Notes (Signed)
ANTICOAGULATION CONSULT NOTE - Initial Consult  Pharmacy Consult for Warfarin Indication: atrial fibrillation  No Known Allergies  Patient Measurements:     Vital Signs: Temp: 98.3 F (36.8 C) (06/28 1051) Temp Source: Rectal (06/28 1051) BP: 116/85 mmHg (06/28 1430) Pulse Rate: 84 (06/28 1430)  Labs:  Recent Labs  02/15/16 1108 02/15/16 1152  HGB 11.3*  --   HCT 34.3*  --   PLT 202  --   LABPROT  --  20.9*  INR  --  1.88*  CREATININE  --  1.34*    CrCl cannot be calculated (Unknown ideal weight.).   Medical History: Past Medical History  Diagnosis Date  . Atrial fibrillation ([place])     coumadin - follow by Reinaldo Meeker at [facility]  . Hyperlipidemia   . Hypertension   . History of anterior wall myocardial infarction 09/17/2008  . History of nuclear stress test 06/14/2010    bruce protocol myoview; stress images show perfusion defect in apical wall (scar)  . CAD (coronary artery disease)     Medications:  Scheduled:  . atorvastatin  40 mg Oral QODAY  . [START ON 02/16/2016] isosorbide mononitrate  60 mg Oral QAC breakfast  . metoprolol  75 mg Oral BID  . multivitamin with minerals  1 tablet Oral Daily  . potassium chloride  40 mEq Oral TID  . ramipril  10 mg Oral Daily   Infusions:  . sodium chloride 100 mL/hr at 02/15/16 1115   PRN: acetaminophen **OR** acetaminophen, nitroGLYCERIN, ondansetron (ZOFRAN) IV  Assessment: 35 yoM presenting to the ER with altered mental status s/p recent fall. Ct head-no hemorrhage. He was on Coumadin 2.5mg  MWFSat and 5mg  TTHSun with last dose on 6/26 for Afib. INR today=1.88  Goal of Therapy:  INR 2-3 Monitor platelets by anticoagulation protocol: Yes   Plan:  Will give Coumadin 2.5mg  po x1 tonight. Will check daily PT/INR and CBC while on Coumadin.  Garnet Sierras 02/15/2016,3:53 PM

## 2016-02-15 NOTE — ED Provider Notes (Signed)
CSN: [id number]     Arrival date & time 02/15/16  1026 History   First MD Initiated Contact with Patient 02/15/16 1043     Chief Complaint  Patient presents with  . Altered Mental Status      HPI Per EMS- Patient lives alone. Patient's daughter visits him daily. Today, the daughter found the patient sleeping on the floor in the bedroom. Patient had urinated in a dresser drawer and while she was there The patient crawled to the bathroom and fell against the tub. Patient's daughter states that the patient is normally confused to day and date only, but today confusion is much worse. Patient c/o feeling tired. Patient had atrial fib on the monitor and has a history of the same. Patient is also on Coumadin. Past Medical History  Diagnosis Date  . Atrial fibrillation ([place])     coumadin - follow by Reinaldo Meeker at [facility]  . Hyperlipidemia   . Hypertension   . History of anterior wall myocardial infarction 09/17/2008  . History of nuclear stress test 06/14/2010    bruce protocol myoview; stress images show perfusion defect in apical wall (scar)  . CAD (coronary artery disease)    Past Surgical History  Procedure Laterality Date  . Eye surgery Bilateral     cataract  . Esophagogastroduodenoscopy N/A 10/29/2012    Procedure: ESOPHAGOGASTRODUODENOSCOPY (EGD);  Surgeon: Arta Silence, MD;  Location: [facility];  Service: Endoscopy;  Laterality: N/A;  . Hot hemostasis N/A 10/29/2012    Procedure: HOT HEMOSTASIS (ARGON PLASMA COAGULATION/BICAP);  Surgeon: Arta Silence, MD;  Location: [facility];  Service: Endoscopy;  Laterality: N/A;  . Coronary angioplasty  09/17/2008    r/t acute anterior wall MI; apical LAD lesion  . Transthoracic echocardiogram  06/29/2010    EF=>55%, normal LV systolic function; LA & RA mod dilated; mild mitral annular calcif & mild MR; mod TR & elevated RV systolic pressure (mild pulm HTN); AV mildly sclerotic; mild pulm valve regurg  . Prostate surgery  2000  .  Hemorrhoid surgery  2005   Family History  Problem Relation Age of Onset  . Stroke Mother   . Aneurysm Father     abdominal  . Cancer Maternal Grandmother   . Emphysema Sister   . Cancer Brother   . Heart disease Brother   . Cancer Sister    Social History  Substance Use Topics  . Smoking status: Never Smoker   . Smokeless tobacco: Current User    Types: Chew     Comment: occasionally  . Alcohol Use: No    Review of Systems  All other systems reviewed and are negative.     Allergies  Review of patient's allergies indicates no known allergies.  Home Medications   Prior to Admission medications   Medication Sig Start Date End Date Taking? Authorizing Provider  atorvastatin (LIPITOR) 40 MG tablet Take 40 mg by mouth every other day.   Yes Historical Provider, MD  diphenhydrAMINE (BENADRYL) 25 MG tablet Take 25 mg by mouth every 6 (six) hours as needed for itching or allergies.   Yes Historical Provider, MD  isosorbide mononitrate (IMDUR) 60 MG 24 hr tablet Take 60 mg by mouth daily before breakfast.   Yes Historical Provider, MD  metoprolol (LOPRESSOR) 50 MG tablet Take 75 mg by mouth 2 (two) times daily.   Yes Historical Provider, MD  Multiple Vitamin (MULTIVITAMIN WITH MINERALS) TABS tablet Take 1 tablet by mouth daily.   Yes Historical Provider, MD  ramipril (ALTACE) 10 MG capsule Take 10 mg by mouth daily.   Yes Historical Provider, MD  nitroGLYCERIN (NITROSTAT) 0.4 MG SL tablet Place 1 tablet (0.4 mg total) under the tongue every 5 (five) minutes as needed for chest pain. 06/07/14   Isaiah Serge, NP  warfarin (COUMADIN) 5 MG tablet Take 2.5-5 mg by mouth daily. Take 1/2 tablet on Monday Wednesday  Friday and Saturday and 1 tablet daily the rest of the week. 10/30/12   Arta Silence, MD   BP 126/103 mmHg  Pulse 91  Temp(Src) 98.3 F (36.8 C) (Rectal)  Resp 19  SpO2 100% Physical Exam Physical Exam  Nursing note and vitals reviewed. Constitutional: He is  oriented to person, and time. He appears well-developed and well-nourished. No distress.  answers questions appropriately but appears sleepy. HENT:  Head: Normocephalic and atraumatic.  Eyes: Pupils are equal, round, and reactive to light.  Neck: Normal range of motion.  Cardiovascular: Normal rate and intact distal pulses.   Pulmonary/Chest: No respiratory distress.  Abdominal: Normal appearance. He exhibits no distension.  Musculoskeletal: Normal range of motion.  Neurological: He is alert and oriented to person, and time.  Slight drooping of the right corner of his mouth.  No definite upper extremity drift.  Able to raise both legs off the bed. Skin: Skin is warm and dry. No rash noted.  Psychiatric: He has a normal mood and affect. His behavior is normal.   ED Course  Procedures (including critical care time) Labs Review Labs Reviewed  CBC WITH DIFFERENTIAL/PLATELET - Abnormal; Notable for the following:    RBC 3.71 (*)    Hemoglobin 11.3 (*)    HCT 34.3 (*)    Lymphs Abs 0.5 (*)    All other components within normal limits  COMPREHENSIVE METABOLIC PANEL - Abnormal; Notable for the following:    Potassium 3.2 (*)    Creatinine, Ser 1.34 (*)    Calcium 8.6 (*)    ALT 8 (*)    GFR calc non Af Amer 44 (*)    GFR calc Af Amer 51 (*)    All other components within normal limits  PROTIME-INR - Abnormal; Notable for the following:    Prothrombin Time 20.9 (*)    INR 1.88 (*)    All other components within normal limits  TSH  URINALYSIS, ROUTINE W REFLEX MICROSCOPIC (NOT AT [facility])  T4, FREE    Imaging Review Dg Chest 2 View  02/15/2016  CLINICAL DATA:  Worsening confusion.  Altered mental status. EXAM: CHEST  2 VIEW COMPARISON:  10/23/2013 FINDINGS: Mild cardiomegaly. There is prominence of soft tissue in the right paratracheal region, not definitively seen on prior study. No focal airspace opacities or edema. No effusions or acute bony abnormality. IMPRESSION: Mild cardiomegaly.  Mild prominence of right paratracheal soft tissue. I favor this is vascular or possibly related to goiter. Mediastinal adenopathy cannot be completely excluded. This could be further evaluated with chest CT with IV contrast if felt clinically indicated. Electronically Signed   By: Rolm Baptise M.D.   On: 02/15/2016 11:42   Ct Head Wo Contrast  02/15/2016  CLINICAL DATA:  Recent fall with altered level of consciousness EXAM: CT HEAD WITHOUT CONTRAST TECHNIQUE: Contiguous axial images were obtained from the base of the skull through the vertex without intravenous contrast. COMPARISON:  07/12/2004 FINDINGS: Several motion artifact is identified limiting the exam. Mild atrophic changes are noted. No findings to suggest acute hemorrhage, acute infarction or space-occupying mass  lesion are noted. IMPRESSION: Mild atrophic changes without acute abnormality. Electronically Signed   By: Inez Catalina M.D.   On: 02/15/2016 11:53   I have personally reviewed and evaluated these images and lab results as part of my medical decision-making.   EKG Interpretation   Date/Time:  Wednesday February 15 2016 10:45:17 EDT Ventricular Rate:  86 PR Interval:    QRS Duration: 94 QT Interval:  394 QTC Calculation: 472 R Axis:   -21 Text Interpretation:  Atrial fibrillation Borderline left axis deviation  Low voltage, extremity leads Inverted T waves have corrected Abnormal ekg  Confirmed by Tommy Goostree  MD, Fadel Clason ([id number]) on 02/15/2016 11:39:49 AM      MDM   Final diagnoses:  Altered level of consciousness        Raliegh Schwartz, MD 02/15/16 1425

## 2016-02-15 NOTE — ED Notes (Signed)
Patient transported to X-ray 

## 2016-02-15 NOTE — ED Notes (Signed)
Attempted to call report to the receiving nurse. Receiving nurse to call back for report.

## 2016-02-16 ENCOUNTER — Observation Stay (HOSPITAL_COMMUNITY): Payer: Medicare Other

## 2016-02-16 ENCOUNTER — Observation Stay (HOSPITAL_BASED_OUTPATIENT_CLINIC_OR_DEPARTMENT_OTHER)
Admit: 2016-02-16 | Discharge: 2016-02-16 | Disposition: A | Discharge status: Home / Self Care | Payer: Medicare Other | Attending: Internal Medicine | Admitting: Internal Medicine

## 2016-02-16 DIAGNOSIS — Z01818 Encounter for other preprocedural examination: Secondary | ICD-10-CM | POA: Diagnosis not present

## 2016-02-16 DIAGNOSIS — I480 Paroxysmal atrial fibrillation: Secondary | ICD-10-CM | POA: Diagnosis not present

## 2016-02-16 DIAGNOSIS — G9340 Encephalopathy, unspecified: Secondary | ICD-10-CM

## 2016-02-16 DIAGNOSIS — D531 Other megaloblastic anemias, not elsewhere classified: Secondary | ICD-10-CM

## 2016-02-16 DIAGNOSIS — I639 Cerebral infarction, unspecified: Secondary | ICD-10-CM | POA: Diagnosis not present

## 2016-02-16 DIAGNOSIS — R410 Disorientation, unspecified: Secondary | ICD-10-CM | POA: Diagnosis not present

## 2016-02-16 LAB — PROTIME-INR
INR: 2.2 — ABNORMAL HIGH (ref 0.00–1.49)
PROTHROMBIN TIME: 23.5 s — AB (ref 11.6–15.2)

## 2016-02-16 LAB — CBC
HEMATOCRIT: 33.9 % — AB (ref 39.0–52.0)
Hemoglobin: 11 g/dL — ABNORMAL LOW (ref 13.0–17.0)
MCH: 30.5 pg (ref 26.0–34.0)
MCHC: 32.4 g/dL (ref 30.0–36.0)
MCV: 93.9 fL (ref 78.0–100.0)
PLATELETS: 155 10*3/uL (ref 150–400)
RBC: 3.61 MIL/uL — AB (ref 4.22–5.81)
RDW: 15 % (ref 11.5–15.5)
WBC: 4.1 10*3/uL (ref 4.0–10.5)

## 2016-02-16 LAB — BASIC METABOLIC PANEL
Anion gap: 8 (ref 5–15)
BUN: 17 mg/dL (ref 6–20)
CHLORIDE: 111 mmol/L (ref 101–111)
CO2: 19 mmol/L — ABNORMAL LOW (ref 22–32)
Calcium: 8.2 mg/dL — ABNORMAL LOW (ref 8.9–10.3)
Creatinine, Ser: 1.11 mg/dL (ref 0.61–1.24)
GFR, EST NON AFRICAN AMERICAN: 56 mL/min — AB (ref >60)
Glucose, Bld: 78 mg/dL (ref 65–99)
POTASSIUM: 4.3 mmol/L (ref 3.5–5.1)
SODIUM: 138 mmol/L (ref 135–145)

## 2016-02-16 LAB — VITAMIN B12: Vitamin B-12: 143 pg/mL — ABNORMAL LOW (ref 180–914)

## 2016-02-16 LAB — HIV ANTIBODY (ROUTINE TESTING W REFLEX): HIV Screen 4th Generation wRfx: NONREACTIVE

## 2016-02-16 LAB — RPR: RPR Ser Ql: NONREACTIVE

## 2016-02-16 MED ORDER — WARFARIN SODIUM 2.5 MG PO TABS
2.5000 mg | ORAL_TABLET | Freq: Once | ORAL | Status: AC
  Administered 2016-02-16: 2.5 mg via ORAL
  Filled 2016-02-16: qty 1

## 2016-02-16 MED ORDER — VITAMIN B-12 1000 MCG PO TABS
1000.0000 ug | ORAL_TABLET | Freq: Every day | ORAL | Status: DC
  Administered 2016-02-16 – 2016-02-19 (×4): 1000 ug via ORAL
  Filled 2016-02-16 (×4): qty 1

## 2016-02-16 MED ORDER — LORAZEPAM 2 MG/ML IJ SOLN
1.0000 mg | Freq: Once | INTRAMUSCULAR | Status: AC
  Administered 2016-02-16: 1 mg via INTRAVENOUS
  Filled 2016-02-16: qty 1

## 2016-02-16 MED ORDER — ATORVASTATIN CALCIUM 80 MG PO TABS
80.0000 mg | ORAL_TABLET | ORAL | Status: DC
  Administered 2016-02-18: 80 mg via ORAL
  Filled 2016-02-16: qty 1

## 2016-02-16 NOTE — Progress Notes (Signed)
MRI brain shows multiple area is of acute infarct involving posterior circulation and cerebellum bilaterally, bilateral occipital lobes and left thalamus as well as possible acute infarct in the right internal capsule. Spoke  with neuro hospitalist Dr. Rondel Jumbo. Transfer patient to [facility] for full stroke workup. Will sigh out  my partner Dr. Olevia Bowens. Neurology need to be consulted once patient arrives at [facility].  Daughter updated on the phone

## 2016-02-16 NOTE — Progress Notes (Signed)
PROGRESS NOTE                                                                                                                                                                                                             Patient Demographics:    Craig Richard, is a 82 y.o. male, DOB - 1921-11-16, [id number]  Admit date - 02/15/2016   Admitting Physician Mir Marry Guan, MD  Outpatient Primary MD for the patient is [facility], [place]  LOS -   Outpatient Specialists: none  Chief Complaint  Patient presents with  . Altered Mental Status       Brief Narrative   82 year old independent male living alone history of A. fib on Coumadin, hypertension, hyperlipidemia and prior history of coronary artery disease admitted with acute delirium. Patient was completely normal 1 day prior to admission. On the morning of admission daughter found him asleep on the floor and had urinated in a dresser drawer. He was very confused and unable to walk properly to the bathroom. He tried to urinate but missed the commode and fell hitting his back onto the tub. Did not hit his head or lost consciousness. EMS was called and patient brought to the ED. In the ED his head CT was unremarkable, UA and chest x-ray negative for infection. Labs were unremarkable except for mild hypokalemia. Admitted for further workup.   Subjective:   Patient appears more alert and better oriented today. He still confused stating he is in [place] but remembers where he lives, oriented to time and person (cannot remember the year)   Assessment  & Plan :   Principal problem Acute delirium Appears to be metabolic encephalopathy. His B12 level is significantly low and I will start him on replenishment. However given his gait instability and acute onset of symptoms would also rule out underlying CVA or postictal state. Ordered MRI brain and EEG. TSH, HIV  antibody and RPR normal. See by PT eval and recommended home health with supervision.  A. fib  Rate controlled. On Coumadin with therapeutic INR. Coumadin has been resumed but need to assess for safety prior to discharge.  Hypokalemia Replenish  Essential hypertension Continue home medications  CAD Continue beta blocker and statin      Code Status : Full code  Family Communication  : Daughter at bedside  Disposition Plan  :  Home with home health possibly in the next 24 hours if workup completed and symptoms are better  Barriers For Discharge : Improving symptoms. Pending MRI and EEG  Consults  :  None  Procedures  : CT head  DVT Prophylaxis  : Coumadin  Lab Results  Component Value Date   PLT 155 02/16/2016    Antibiotics  :    Anti-infectives    None        Objective:   Filed Vitals:   02/15/16 2350 02/16/16 0513 02/16/16 0800 02/16/16 0929  BP: 112/91 110/73 149/80   Pulse:  65    Temp:  98.1 F (36.7 C)    TempSrc:  Oral    Resp:  18    Height:    5\' 11"  (1.803 m)  SpO2:  100%      Wt Readings from Last 3 Encounters:  12/13/15 73.029 kg (161 lb)  05/23/15 72.394 kg (159 lb 9.6 oz)  12/08/14 76.159 kg (167 lb 14.4 oz)     Intake/Output Summary (Last 24 hours) at 02/16/16 1345 Last data filed at 02/16/16 0957  Gross per 24 hour  Intake    340 ml  Output    350 ml  Net    -10 ml     Physical Exam  Gen: not in distress HEENT: no pallor, moist mucosa, supple neck Chest: clear b/l, no added sounds CVS: N S1&S2, no murmurs, rubs or gallop GI: soft, NT, ND, BS+ Musculoskeletal: warm, no edema CNS: AAOX1-2, nonfocal    Data Review:    CBC  Recent Labs Lab 02/15/16 1108 02/16/16 0344  WBC 5.9 4.1  HGB 11.3* 11.0*  HCT 34.3* 33.9*  PLT 202 155  MCV 92.5 93.9  MCH 30.5 30.5  MCHC 32.9 32.4  RDW 15.1 15.0  LYMPHSABS 0.5*  --   MONOABS 0.8  --   EOSABS 0.0  --   BASOSABS 0.0  --     Chemistries   Recent Labs Lab  02/15/16 1152 02/16/16 0344  NA 136 138  K 3.2* 4.3  CL 103 111  CO2 23 19*  GLUCOSE 94 78  BUN 19 17  CREATININE 1.34* 1.11  CALCIUM 8.6* 8.2*  AST 15  --   ALT 8*  --   ALKPHOS 73  --   BILITOT 1.2  --    ------------------------------------------------------------------------------------------------------------------ No results for input(s): CHOL, HDL, LDLCALC, TRIG, CHOLHDL, LDLDIRECT in the last 72 hours.  No results found for: HGBA1C ------------------------------------------------------------------------------------------------------------------  Recent Labs  02/15/16 1234  TSH 1.955   ------------------------------------------------------------------------------------------------------------------  Recent Labs  02/16/16 0811  VITAMINB12 143*    Coagulation profile  Recent Labs Lab 02/15/16 1152 02/16/16 0344  INR 1.88* 2.20*    No results for input(s): DDIMER in the last 72 hours.  Cardiac Enzymes No results for input(s): CKMB, TROPONINI, MYOGLOBIN in the last 168 hours.  Invalid input(s): CK ------------------------------------------------------------------------------------------------------------------ No results found for: BNP  Inpatient Medications  Scheduled Meds: . atorvastatin  40 mg Oral QODAY  . feeding supplement (ENSURE ENLIVE)  237 mL Oral BID BM  . isosorbide mononitrate  60 mg Oral QAC breakfast  . metoprolol  75 mg Oral BID  . multivitamin with minerals  1 tablet Oral Daily  . ramipril  10 mg Oral Daily  . warfarin  2.5 mg Oral ONCE-1800  . Warfarin - Pharmacist Dosing Inpatient   Does not apply q1800   Continuous Infusions:  PRN Meds:.acetaminophen **OR** acetaminophen, nitroGLYCERIN  Micro Results  No results found for this or any previous visit (from the past 240 hour(s)).  Radiology Reports Dg Eye Foreign Body  02/16/2016  CLINICAL DATA:  Metal working/exposure; clearance prior to MRI EXAM: ORBITS FOR FOREIGN BODY - 2  VIEW COMPARISON:  None. FINDINGS: There is no evidence of metallic foreign body within the orbits. No significant bone abnormality identified. IMPRESSION: No evidence of metallic foreign body within the orbits. Electronically Signed   By: Claudie Revering M.D.   On: 02/16/2016 13:37   Dg Chest 2 View  02/15/2016  CLINICAL DATA:  Worsening confusion.  Altered mental status. EXAM: CHEST  2 VIEW COMPARISON:  10/23/2013 FINDINGS: Mild cardiomegaly. There is prominence of soft tissue in the right paratracheal region, not definitively seen on prior study. No focal airspace opacities or edema. No effusions or acute bony abnormality. IMPRESSION: Mild cardiomegaly. Mild prominence of right paratracheal soft tissue. I favor this is vascular or possibly related to goiter. Mediastinal adenopathy cannot be completely excluded. This could be further evaluated with chest CT with IV contrast if felt clinically indicated. Electronically Signed   By: Rolm Baptise M.D.   On: 02/15/2016 11:42   Ct Head Wo Contrast  02/15/2016  CLINICAL DATA:  Recent fall with altered level of consciousness EXAM: CT HEAD WITHOUT CONTRAST TECHNIQUE: Contiguous axial images were obtained from the base of the skull through the vertex without intravenous contrast. COMPARISON:  07/12/2004 FINDINGS: Several motion artifact is identified limiting the exam. Mild atrophic changes are noted. No findings to suggest acute hemorrhage, acute infarction or space-occupying mass lesion are noted. IMPRESSION: Mild atrophic changes without acute abnormality. Electronically Signed   By: Inez Catalina M.D.   On: 02/15/2016 11:53    Time Spent in minutes  25   Louellen Molder M.D on 02/16/2016 at 1:45 PM  Between 7am to 7pm - Pager - [phone]  After 7pm go to [email or web address] - password [facility]  [facility] -  Office  [phone]

## 2016-02-16 NOTE — Progress Notes (Signed)
REPORT GIVEN TO DIONE Annye English  AT THIS TIME PT LEFT FLOOR WITH CARE LINK AT THIS TIME TRANSPORTING PT TO [place]

## 2016-02-16 NOTE — Progress Notes (Signed)
[place] for Warfarin Indication: atrial fibrillation  No Known Allergies  Patient Measurements: Height: 5\' 11"  (180.3 cm) IBW/kg (Calculated) : 75.3  Vital Signs: Temp: 98.1 F (36.7 C) (06/29 0513) Temp Source: Oral (06/29 0513) BP: 149/80 mmHg (06/29 0800) Pulse Rate: 65 (06/29 0513)  Labs:  Recent Labs  02/15/16 1108 02/15/16 1152 02/16/16 0344  HGB 11.3*  --  11.0*  HCT 34.3*  --  33.9*  PLT 202  --  155  LABPROT  --  20.9* 23.5*  INR  --  1.88* 2.20*  CREATININE  --  1.34* 1.11   CrCl cannot be calculated (Unknown ideal weight.).  Medical History: Past Medical History  Diagnosis Date  . Atrial fibrillation ([place])     coumadin - follow by Reinaldo Meeker at [facility]  . Hyperlipidemia   . Hypertension   . History of anterior wall myocardial infarction 09/17/2008  . History of nuclear stress test 06/14/2010    bruce protocol myoview; stress images show perfusion defect in apical wall (scar)  . CAD (coronary artery disease)    Medications:  Scheduled:  . atorvastatin  40 mg Oral QODAY  . feeding supplement (ENSURE ENLIVE)  237 mL Oral BID BM  . isosorbide mononitrate  60 mg Oral QAC breakfast  . metoprolol  75 mg Oral BID  . multivitamin with minerals  1 tablet Oral Daily  . ramipril  10 mg Oral Daily  . Warfarin - Pharmacist Dosing Inpatient   Does not apply q1800   Assessment: 63 yoM presenting to the ER with altered mental status s/p recent fall. Ct head-no hemorrhage. He was on Coumadin 2.5mg  MWFSat and 5mg  TTHSun with last dose on 6/26 for Afib. INR on admit 6/28 = 1.88  Today, 02/16/2016  INR 2.20 after 2.5mg  Warfarin  Regular diet ordered  Goal of Therapy:  INR 2-3 Monitor platelets by anticoagulation protocol: Yes   Plan:   Coumadin 2.5mg  po x1 today at 1800  Daily PT/INR and CBC while on Coumadin.  Minda Ditto PharmD Pager [phone] 02/16/2016, 9:46 AM

## 2016-02-16 NOTE — Procedures (Addendum)
TECHNICAL SUMMARY:  A multichannel referential and bipolar montage EEG using the standard international 10-20 system was performed on the patient described as awake but restless and touching the electrodes during the recording.  There is an 8-1/2 Hz occipital dominant rhythm.  Low voltage fast activity can be seen distributed symmetrically and maximally in the anterior head regions.  EPILEPTIFORM ACTIVITY:  There were no spikes, sharp waves or paroxysmal activity   ACTIVATION PROCEDURES:  Photic stimulation and hyperventilation are not performed.  SLEEP:  Stage I and stage II sleep are noted.  IMPRESSION:  This is a normal EEG for the patients stated age.  There were no focal, hemispheric, or lateralizing features.  No epileptiform activity was recorded.

## 2016-02-16 NOTE — Progress Notes (Signed)
OT Cancellation Note  Patient Details Name: Craig Richard MRN: [id number] DOB: June 23, 1922   Cancelled Treatment:    Reason Eval/Treat Not Completed: Patient at procedure or test/ unavailable.  Pt is now getting EEG. Will check back tomorrow  Finas Delone 02/16/2016, 3:14 PM  Lesle Chris, OTR/L [phone] 02/16/2016

## 2016-02-16 NOTE — Progress Notes (Signed)
Offsite EEG completed at [facility]; results pending. 

## 2016-02-16 NOTE — Evaluation (Signed)
Physical Therapy Evaluation Patient Details Name: Craig Richard MRN: [id number] DOB: [birth year] Today's Date: 02/16/2016   History of Present Illness  82 y.o. male with medical history significant for Afib on coumadin, and prior CAD who is being admitted due to delirium. Pt's daughter was present in ER and provided history, he is very independent, lives alone, cooks, performs own ADLs and still drives  Clinical Impression  Pt admitted with above diagnosis. Pt currently with functional limitations due to the deficits listed below (see PT Problem List).  Pt will benefit from skilled PT to increase their independence and safety with mobility to allow discharge to the venue listed below.   Pt's mobility min/guard at this time due to decreased cognition.  Once cognition improves, anticipate pt will return to baseline in regards to mobility.  Recommending 24/7 supervision and HHPT at this time however pt may progress to no needs once mental status improves.     Follow Up Recommendations Home health PT;Supervision/Assistance - 24 hour    Equipment Recommendations  Rolling walker with 5" wheels    Recommendations for Other Services       Precautions / Restrictions Precautions Precautions: Fall      Mobility  Bed Mobility Overal bed mobility: Modified Independent             General bed mobility comments: pt attempting to get OOB with bed alarm sounding just upon arriving to room  Transfers Overall transfer level: Needs assistance Equipment used: None Transfers: Sit to/from Stand Sit to Stand: Min guard         General transfer comment: pt reaches for hand hold assistance, min/guard for safety  Ambulation/Gait Ambulation/Gait assistance: Min guard Ambulation Distance (Feet): 120 Feet Assistive device: 1 person hand held assist Gait Pattern/deviations: Step-through pattern;Decreased stride length;Trunk flexed     General Gait Details: pt requires UE support, either HHA  or using hand rail, mildly unsteady  Stairs            Wheelchair Mobility    Modified Rankin (Stroke Patients Only)       Balance Overall balance assessment: History of Falls                                           Pertinent Vitals/Pain Pain Assessment: No/denies pain    Home Living Family/patient expects to be discharged to:: Private residence Living Arrangements: Alone Available Help at Discharge: Family;Available PRN/intermittently Type of Home: House       Home Layout: One level Home Equipment: Cane - single point      Prior Function Level of Independence: Independent with assistive device(s)         Comments: per RN, daughter visits daily, pt uses SPC     Hand Dominance        Extremity/Trunk Assessment               Lower Extremity Assessment: Generalized weakness      Cervical / Trunk Assessment: Kyphotic  Communication   Communication: No difficulties  Cognition Arousal/Alertness: Awake/alert Behavior During Therapy: WFL for tasks assessed/performed Overall Cognitive Status: No family/caregiver present to determine baseline cognitive functioning Area of Impairment: Orientation Orientation Level: Disoriented to;Time;Situation;Place             General Comments: pt stated he was in [place], year "65", and stated his wife lives with him and she's "around  here someplace"    General Comments      Exercises        Assessment/Plan    PT Assessment Patient needs continued PT services  PT Diagnosis Difficulty walking;Altered mental status   PT Problem List Decreased strength;Decreased mobility;Decreased balance;Decreased cognition;Decreased safety awareness  PT Treatment Interventions DME instruction;Gait training;Functional mobility training;Patient/family education;Therapeutic activities;Therapeutic exercise   PT Goals (Current goals can be found in the Care Plan section) Acute Rehab PT Goals PT  Goal Formulation: Patient unable to participate in goal setting Time For Goal Achievement: 02/23/16 Potential to Achieve Goals: Good    Frequency Min 3X/week   Barriers to discharge        Co-evaluation               End of Session   Activity Tolerance: Patient tolerated treatment well Patient left: in bed;with call bell/phone within reach;with bed alarm set Nurse Communication: Mobility status    Functional Assessment Tool Used: clinical judgement Functional Limitation: Mobility: Walking and moving around Mobility: Walking and Moving Around Current Status [id number]): At least 1 percent but less than 20 percent impaired, limited or restricted Mobility: Walking and Moving Around Goal Status [phone]): At least 1 percent but less than 20 percent impaired, limited or restricted    Time: [id number] PT Time Calculation (min) (ACUTE ONLY): 12 min   Charges:   PT Evaluation $PT Eval Low Complexity: 1 Procedure     PT G Codes:   PT G-Codes **NOT FOR INPATIENT CLASS** Functional Assessment Tool Used: clinical judgement Functional Limitation: Mobility: Walking and moving around Mobility: Walking and Moving Around Current Status [id number]): At least 1 percent but less than 20 percent impaired, limited or restricted Mobility: Walking and Moving Around Goal Status [phone]): At least 1 percent but less than 20 percent impaired, limited or restricted    Lakeeta Dobosz,KATHrine E 02/16/2016, 12:08 PM Carmelia Bake, PT, DPT 02/16/2016 Pager: [phone]

## 2016-02-16 NOTE — Care Management Note (Signed)
Case Management Note  Patient Details  Name: DEVYN FLOCCO MRN: [id number] Date of Birth: 1922-06-16  Subjective/Objective:    82 yo admitted with AMS                Action/Plan: From home alone.  PT recommendations gone over with pt and daughter.  Choice offered for [facility] services and [facility] chosen.  WC and RW requested by daughter. Orders received and [facility] rep/DME rep both contacted for referrals. CM will continue to follow for assistance.  Expected Discharge Date:   (unknown)               Expected Discharge Plan:  [place]  In-House Referral:     Discharge planning Services  CM Consult  Post Acute Care Choice:  Home Health Choice offered to:  Patient, Adult Children  DME Arranged:  Walker rolling, Wheelchair manual DME Agency:  [place]:  PT, Nurse's Aide [place] Agency:  [place]  Status of Service:  In process, will continue to follow  If discussed at Long Length of Stay Meetings, dates discussed:    Additional CommentsLynnell Catalan, RN 02/16/2016, 1:29 PM  [phone]

## 2016-02-16 NOTE — Care Management Obs Status (Signed)
[place] NOTIFICATION   Patient Details  Name: Craig Richard MRN: [id number] Date of Birth: [birth year]   Medicare Observation Status Notification Given:  Yes    Lynnell Catalan, RN 02/16/2016, 12:53 PM

## 2016-02-16 NOTE — Progress Notes (Signed)
OT Cancellation Note  Patient Details Name: Craig Richard MRN: [id number] DOB: 06-Sep-1921   Cancelled Treatment:    Reason Eval/Treat Not Completed: Patient at procedure or test/ unavailable.  Pt is at MRI.    [name] 02/16/2016, 1:54 PM  Lesle Chris, OTR/L [phone] 02/16/2016

## 2016-02-16 NOTE — Progress Notes (Signed)
Initial Nutrition Assessment  DOCUMENTATION CODES:   Not applicable  INTERVENTION:  -Ensure Enlive po BID, each supplement provides 350 kcal and 20 grams of protein -Encourage PO intake  NUTRITION DIAGNOSIS:   Malnutrition related to chronic illness as evidenced by severe depletion of muscle mass, severe depletion of body fat.  GOAL:   Patient will meet greater than or equal to 90% of their needs  MONITOR:   PO intake, Supplement acceptance, Labs, I & O's, Skin  REASON FOR ASSESSMENT:   Malnutrition Screening Tool    ASSESSMENT:   Craig Richard is a 82 y.o. male with medical history significant for Afib on coumadin, and prior CAD who is being admitted due to delirium. Pt's daughter is present in ER and provided history, he is very independent, lives alone, cooks, performs own ADLs and still drives. He went to coumadin clinic yesterday and was totally fine. This AM daughter came to his house and found him asleep on the floor and saw that he had urinated in a dresser drawer. He was very confused, was able to walk to bathroom and then fell, hitting his upper back but not his head  Craig Richard is a pleasant 82 yo man who endorses good appetite at home but is unsure of weight loss. We have no new weight on him in the chart. He consumed 100% of his breakfast today per documented PO intake.  Claims a usual weight of 170# Mentation seems to have returned to normal. No issues chewing or swallowing No concerns with nausea and vomiting. Is receiving Ensure, and is consuming states "its ok, I'll drink it." Encouraged PO Intake and consumption of Ensure.  Nutrition-Focused physical exam completed. Findings are moderate fat depletion, moderate muscle depletion, and no edema.   Labs and Medications reviewed:  Diet Order:  Diet Heart Room service appropriate?: Yes; Fluid consistency:: Thin  Skin:  Wound (see comment) (Skin tear to L Elbow)  Last BM:  6/28  Height:   Ht Readings  from Last 1 Encounters:  02/16/16 5\' 11"  (1.803 m)    Weight:   Wt Readings from Last 1 Encounters:  12/13/15 161 lb (73.029 kg)    Ideal Body Weight:  78.18 kg  BMI:  There is no weight on file to calculate BMI.  Estimated Nutritional Needs:   Kcal:  1700-2000 calories  Protein:  75-100 grams  Fluid:  >/= 1.7 L  EDUCATION NEEDS:   No education needs identified at this time  Satira Anis. Johnisha Louks, MS, RD LDN Inpatient Clinical Dietitian Pager [phone]

## 2016-02-17 ENCOUNTER — Inpatient Hospital Stay (HOSPITAL_COMMUNITY): Payer: Medicare Other

## 2016-02-17 DIAGNOSIS — I75011 Atheroembolism of right upper extremity: Secondary | ICD-10-CM | POA: Diagnosis not present

## 2016-02-17 DIAGNOSIS — Z9861 Coronary angioplasty status: Secondary | ICD-10-CM | POA: Diagnosis not present

## 2016-02-17 DIAGNOSIS — I6789 Other cerebrovascular disease: Secondary | ICD-10-CM | POA: Diagnosis not present

## 2016-02-17 DIAGNOSIS — Z66 Do not resuscitate: Secondary | ICD-10-CM | POA: Diagnosis present

## 2016-02-17 DIAGNOSIS — G9340 Encephalopathy, unspecified: Secondary | ICD-10-CM | POA: Diagnosis not present

## 2016-02-17 DIAGNOSIS — E538 Deficiency of other specified B group vitamins: Secondary | ICD-10-CM | POA: Diagnosis present

## 2016-02-17 DIAGNOSIS — I252 Old myocardial infarction: Secondary | ICD-10-CM | POA: Diagnosis not present

## 2016-02-17 DIAGNOSIS — Z7901 Long term (current) use of anticoagulants: Secondary | ICD-10-CM | POA: Diagnosis not present

## 2016-02-17 DIAGNOSIS — F17220 Nicotine dependence, chewing tobacco, uncomplicated: Secondary | ICD-10-CM | POA: Diagnosis present

## 2016-02-17 DIAGNOSIS — Y92002 Bathroom of unspecified non-institutional (private) residence single-family (private) house as the place of occurrence of the external cause: Secondary | ICD-10-CM | POA: Diagnosis not present

## 2016-02-17 DIAGNOSIS — I2510 Atherosclerotic heart disease of native coronary artery without angina pectoris: Secondary | ICD-10-CM | POA: Diagnosis present

## 2016-02-17 DIAGNOSIS — I639 Cerebral infarction, unspecified: Secondary | ICD-10-CM | POA: Diagnosis not present

## 2016-02-17 DIAGNOSIS — W01198A Fall on same level from slipping, tripping and stumbling with subsequent striking against other object, initial encounter: Secondary | ICD-10-CM | POA: Diagnosis present

## 2016-02-17 DIAGNOSIS — I4891 Unspecified atrial fibrillation: Secondary | ICD-10-CM | POA: Diagnosis present

## 2016-02-17 DIAGNOSIS — G9341 Metabolic encephalopathy: Secondary | ICD-10-CM | POA: Diagnosis present

## 2016-02-17 DIAGNOSIS — I10 Essential (primary) hypertension: Secondary | ICD-10-CM | POA: Diagnosis present

## 2016-02-17 DIAGNOSIS — E785 Hyperlipidemia, unspecified: Secondary | ICD-10-CM | POA: Diagnosis present

## 2016-02-17 DIAGNOSIS — R404 Transient alteration of awareness: Secondary | ICD-10-CM | POA: Diagnosis not present

## 2016-02-17 DIAGNOSIS — I6523 Occlusion and stenosis of bilateral carotid arteries: Secondary | ICD-10-CM | POA: Diagnosis present

## 2016-02-17 DIAGNOSIS — I748 Embolism and thrombosis of other arteries: Secondary | ICD-10-CM | POA: Diagnosis present

## 2016-02-17 DIAGNOSIS — E876 Hypokalemia: Secondary | ICD-10-CM | POA: Diagnosis present

## 2016-02-17 DIAGNOSIS — I63233 Cerebral infarction due to unspecified occlusion or stenosis of bilateral carotid arteries: Secondary | ICD-10-CM | POA: Diagnosis not present

## 2016-02-17 DIAGNOSIS — Z79899 Other long term (current) drug therapy: Secondary | ICD-10-CM | POA: Diagnosis not present

## 2016-02-17 LAB — BASIC METABOLIC PANEL
Anion gap: 7 (ref 5–15)
BUN: 21 mg/dL — ABNORMAL HIGH (ref 6–20)
CALCIUM: 9 mg/dL (ref 8.9–10.3)
CO2: 26 mmol/L (ref 22–32)
CREATININE: 1.37 mg/dL — AB (ref 0.61–1.24)
Chloride: 106 mmol/L (ref 101–111)
GFR calc Af Amer: 50 mL/min — ABNORMAL LOW (ref >60)
GFR calc non Af Amer: 43 mL/min — ABNORMAL LOW (ref >60)
GLUCOSE: 158 mg/dL — AB (ref 65–99)
Potassium: 3.8 mmol/L (ref 3.5–5.1)
Sodium: 139 mmol/L (ref 135–145)

## 2016-02-17 LAB — LIPID PANEL
CHOL/HDL RATIO: 3.3 ratio
CHOLESTEROL: 106 mg/dL (ref 0–200)
HDL: 32 mg/dL — AB (ref >40)
LDL Cholesterol: 64 mg/dL (ref 0–99)
TRIGLYCERIDES: 50 mg/dL (ref <150)
VLDL: 10 mg/dL (ref 0–40)

## 2016-02-17 LAB — CBC
HCT: 36.3 % — ABNORMAL LOW (ref 39.0–52.0)
HEMOGLOBIN: 11.2 g/dL — AB (ref 13.0–17.0)
MCH: 29.6 pg (ref 26.0–34.0)
MCHC: 30.9 g/dL (ref 30.0–36.0)
MCV: 95.8 fL (ref 78.0–100.0)
PLATELETS: 180 10*3/uL (ref 150–400)
RBC: 3.79 MIL/uL — ABNORMAL LOW (ref 4.22–5.81)
RDW: 15.1 % (ref 11.5–15.5)
WBC: 4.9 10*3/uL (ref 4.0–10.5)

## 2016-02-17 LAB — FOLATE RBC
FOLATE, HEMOLYSATE: 280.6 ng/mL
FOLATE, RBC: 891 ng/mL (ref >498)
Hematocrit: 31.5 % — ABNORMAL LOW (ref 37.5–51.0)

## 2016-02-17 LAB — PROTIME-INR
INR: 1.76 — ABNORMAL HIGH (ref 0.00–1.49)
PROTHROMBIN TIME: 20.5 s — AB (ref 11.6–15.2)

## 2016-02-17 MED ORDER — SENNOSIDES-DOCUSATE SODIUM 8.6-50 MG PO TABS: 1.0000 | ORAL_TABLET | Freq: Every evening | ORAL | Status: DC | PRN

## 2016-02-17 MED ORDER — STROKE: EARLY STAGES OF RECOVERY BOOK
Freq: Once | Status: AC
  Administered 2016-02-17: 08:00:00

## 2016-02-17 MED ORDER — APIXABAN 2.5 MG PO TABS
2.5000 mg | ORAL_TABLET | Freq: Two times a day (BID) | ORAL | Status: DC
  Administered 2016-02-17 – 2016-02-19 (×4): 2.5 mg via ORAL
  Filled 2016-02-17 (×4): qty 1

## 2016-02-17 MED ORDER — SODIUM CHLORIDE 0.9 % IV SOLN
INTRAVENOUS | Status: DC
  Administered 2016-02-17: 01:00:00 via INTRAVENOUS

## 2016-02-17 NOTE — Consult Note (Signed)
Admission H&P    Chief Complaint: Altered mental status.  HPI: Craig Richard is an 82 y.o. male with a history of atrial fibrillation on anticoagulation, hypertension, hyperlipidemia and coronary artery disease, admitted to [facility] on 02/15/2016 for acute altered mental status with confusion. Patient reportedly is usually of sound mind and independent and able to care for himself. CT scan on 02/15/2016 was unremarkable for acute changes. MRI on 02/16/2016 showed multiple areas of acute infarction involving cerebellum as well as right and left occipital regions. Patient has not experienced visual changes nor vertigo or nausea. He is on Coumadin and his INR on admission was 1.88. MRI on 02/16/2016 was 2.20. He had an EEG on 02/16/2016 which was normal. NIH stroke score at the time of this evaluation was 1.  LSN: 02/14/2016 tPA Given: No: Unclear exactly when he was last known well; no clear focal deficit mRankin:  Past Medical History  Diagnosis Date  . Atrial fibrillation ([place])     coumadin - follow by Reinaldo Meeker at [facility]  . Hyperlipidemia   . Hypertension   . History of anterior wall myocardial infarction 09/17/2008  . History of nuclear stress test 06/14/2010    bruce protocol myoview; stress images show perfusion defect in apical wall (scar)  . CAD (coronary artery disease)     Past Surgical History  Procedure Laterality Date  . Eye surgery Bilateral     cataract  . Esophagogastroduodenoscopy N/A 10/29/2012    Procedure: ESOPHAGOGASTRODUODENOSCOPY (EGD);  Surgeon: Arta Silence, MD;  Location: [facility];  Service: Endoscopy;  Laterality: N/A;  . Hot hemostasis N/A 10/29/2012    Procedure: HOT HEMOSTASIS (ARGON PLASMA COAGULATION/BICAP);  Surgeon: Arta Silence, MD;  Location: [facility];  Service: Endoscopy;  Laterality: N/A;  . Coronary angioplasty  09/17/2008    r/t acute anterior wall MI; apical LAD lesion  . Transthoracic echocardiogram  06/29/2010   EF=>55%, normal LV systolic function; LA & RA mod dilated; mild mitral annular calcif & mild MR; mod TR & elevated RV systolic pressure (mild pulm HTN); AV mildly sclerotic; mild pulm valve regurg  . Prostate surgery  2000  . Hemorrhoid surgery  2005    Family History  Problem Relation Age of Onset  . Stroke Mother   . Aneurysm Father     abdominal  . Cancer Maternal Grandmother   . Emphysema Sister   . Cancer Brother   . Heart disease Brother   . Cancer Sister    Social History:  reports that he has never smoked. His smokeless tobacco use includes Chew. He reports that he does not drink alcohol or use illicit drugs.  Allergies: No Known Allergies  Medications Prior to Admission  Medication Sig Dispense Refill  . atorvastatin (LIPITOR) 40 MG tablet Take 40 mg by mouth every other day.    . diphenhydrAMINE (BENADRYL) 25 MG tablet Take 25 mg by mouth every 6 (six) hours as needed for itching or allergies.    . isosorbide mononitrate (IMDUR) 60 MG 24 hr tablet Take 60 mg by mouth daily before breakfast.    . metoprolol (LOPRESSOR) 50 MG tablet Take 75 mg by mouth 2 (two) times daily.    . Multiple Vitamin (MULTIVITAMIN WITH MINERALS) TABS tablet Take 1 tablet by mouth daily.    . ramipril (ALTACE) 10 MG capsule Take 10 mg by mouth daily.    . nitroGLYCERIN (NITROSTAT) 0.4 MG SL tablet Place 1 tablet (0.4 mg total) under the tongue every  5 (five) minutes as needed for chest pain. 25 tablet 6  . warfarin (COUMADIN) 5 MG tablet Take 2.5-5 mg by mouth daily. Take 1/2 tablet on Monday Wednesday  Friday and Saturday and 1 tablet daily the rest of the week.      ROS: History obtained from chart review and the patient  General ROS: negative for - chills, fatigue, fever, night sweats, weight gain or weight loss Psychological ROS: negative for - behavioral disorder, hallucinations, memory difficulties, mood swings or suicidal ideation Ophthalmic ROS: negative for - blurry vision, double  vision, eye pain or loss of vision ENT ROS: negative for - epistaxis, nasal discharge, oral lesions, sore throat, tinnitus or vertigo Allergy and Immunology ROS: negative for - hives or itchy/watery eyes Hematological and Lymphatic ROS: negative for - bleeding problems, bruising or swollen lymph nodes Endocrine ROS: negative for - galactorrhea, hair pattern changes, polydipsia/polyuria or temperature intolerance Respiratory ROS: negative for - cough, hemoptysis, shortness of breath or wheezing Cardiovascular ROS: negative for - chest pain, dyspnea on exertion, edema or irregular heartbeat Gastrointestinal ROS: negative for - abdominal pain, diarrhea, hematemesis, nausea/vomiting or stool incontinence Genito-Urinary ROS: negative for - dysuria, hematuria, incontinence or urinary frequency/urgency Musculoskeletal ROS: negative for - joint swelling or muscular weakness Neurological ROS: as noted in HPI Dermatological ROS: negative for rash and skin lesion changes  Physical Examination: Blood pressure 122/66, pulse 66, temperature 97.7 F (36.5 C), temperature source Oral, resp. rate 18, height 6' (1.829 m), weight 68.8 kg (151 lb 10.8 oz), SpO2 99 %.  HEENT-  Normocephalic, no lesions, without obvious abnormality.  Normal external eye and conjunctiva.  Normal TM's bilaterally.  Normal auditory canals and external ears. Normal external nose, mucus membranes and septum.  Normal pharynx. Neck supple with no masses, nodes, nodules or enlargement. Cardiovascular - irregularly irregular rhythm, S1, S2 normal and no S3 or S4 Lungs - chest clear, no wheezing, rales, normal symmetric air entry Abdomen - soft, non-tender; bowel sounds normal; no masses,  no organomegaly Extremities - no joint deformities, effusion, or inflammation and no edema  Neurologic Examination: Mental Status: Alert, oriented to correct age but disoriented to current month, no acute distress.  Speech fluent without evidence of  aphasia. Able to follow commands without difficulty. Cranial Nerves: II-Visual fields were normal. III/IV/VI-Pupils were equal and reacted normally to light. Extraocular movements were full and conjugate.    V/VII-no facial numbness and no facial weakness. VIII-normal. X-normal speech and symmetrical palatal movement. XI: trapezius strength/neck flexion strength normal bilaterally XII-midline tongue extension with normal strength. Motor: 5/5 bilaterally with normal tone and bulk Sensory: Normal throughout. Deep Tendon Reflexes: 1+ and symmetric in upper extremities and absent in lower extremities. Plantars: Mute bilaterally Cerebellar: Normal finger-to-nose testing. Carotid auscultation: Normal  Results for orders placed or performed during the hospital encounter of 02/15/16 (from the past 48 hour(s))  CBC with Differential/Platelet     Status: Abnormal   Collection Time: 02/15/16 11:08 AM  Result Value Ref Range   WBC 5.9 4.0 - 10.5 K/uL   RBC 3.71 (L) 4.22 - 5.81 MIL/uL   Hemoglobin 11.3 (L) 13.0 - 17.0 g/dL   HCT 34.3 (L) 39.0 - 52.0 %   MCV 92.5 78.0 - 100.0 fL   MCH 30.5 26.0 - 34.0 pg   MCHC 32.9 30.0 - 36.0 g/dL   RDW 15.1 11.5 - 15.5 %   Platelets 202 150 - 400 K/uL   Neutrophils Relative % 77 %   Neutro Abs  4.6 1.7 - 7.7 K/uL   Lymphocytes Relative 9 %   Lymphs Abs 0.5 (L) 0.7 - 4.0 K/uL   Monocytes Relative 14 %   Monocytes Absolute 0.8 0.1 - 1.0 K/uL   Eosinophils Relative 0 %   Eosinophils Absolute 0.0 0.0 - 0.7 K/uL   Basophils Relative 0 %   Basophils Absolute 0.0 0.0 - 0.1 K/uL  Comprehensive metabolic panel     Status: Abnormal   Collection Time: 02/15/16 11:52 AM  Result Value Ref Range   Sodium 136 135 - 145 mmol/L   Potassium 3.2 (L) 3.5 - 5.1 mmol/L   Chloride 103 101 - 111 mmol/L   CO2 23 22 - 32 mmol/L   Glucose, Bld 94 65 - 99 mg/dL   BUN 19 6 - 20 mg/dL   Creatinine, Ser 1.34 (H) 0.61 - 1.24 mg/dL   Calcium 8.6 (L) 8.9 - 10.3 mg/dL   Total  Protein 6.5 6.5 - 8.1 g/dL   Albumin 4.0 3.5 - 5.0 g/dL   AST 15 15 - 41 U/L   ALT 8 (L) 17 - 63 U/L   Alkaline Phosphatase 73 38 - 126 U/L   Total Bilirubin 1.2 0.3 - 1.2 mg/dL   GFR calc non Af Amer 44 (L) >60 mL/min   GFR calc Af Amer 51 (L) >60 mL/min    Comment: (NOTE) The eGFR has been calculated using the CKD EPI equation. This calculation has not been validated in all clinical situations. eGFR's persistently <60 mL/min signify possible Chronic Kidney Disease.    Anion gap 10 5 - 15  Protime-INR     Status: Abnormal   Collection Time: 02/15/16 11:52 AM  Result Value Ref Range   Prothrombin Time 20.9 (H) 11.6 - 15.2 seconds   INR 1.88 (H) 0.00 - 1.49  TSH     Status: None   Collection Time: 02/15/16 12:34 PM  Result Value Ref Range   TSH 1.955 0.350 - 4.500 uIU/mL  T4, free     Status: None   Collection Time: 02/15/16 12:34 PM  Result Value Ref Range   Free T4 0.87 0.61 - 1.12 ng/dL    Comment: (NOTE) Biotin ingestion may interfere with free T4 tests. If the results are inconsistent with the TSH level, previous test results, or the clinical presentation, then consider biotin interference. If needed, order repeat testing after stopping biotin. Performed at [facility]   Urinalysis, Routine w reflex microscopic (not at [facility])     Status: Abnormal   Collection Time: 02/15/16  1:54 PM  Result Value Ref Range   Color, Urine YELLOW YELLOW   APPearance CLEAR CLEAR   Specific Gravity, Urine 1.015 1.005 - 1.030   pH 7.0 5.0 - 8.0   Glucose, UA NEGATIVE NEGATIVE mg/dL   Hgb urine dipstick SMALL (A) NEGATIVE   Bilirubin Urine NEGATIVE NEGATIVE   Ketones, ur 15 (A) NEGATIVE mg/dL   Protein, ur 30 (A) NEGATIVE mg/dL   Nitrite NEGATIVE NEGATIVE   Leukocytes, UA NEGATIVE NEGATIVE  Urine microscopic-add on     Status: Abnormal   Collection Time: 02/15/16  1:54 PM  Result Value Ref Range   Squamous Epithelial / LPF 0-5 (A) NONE SEEN   WBC, UA 0-5 0 - 5 WBC/hpf   RBC  / HPF 0-5 0 - 5 RBC/hpf   Bacteria, UA RARE (A) NONE SEEN  Basic metabolic panel     Status: Abnormal   Collection Time: 02/16/16  3:44 AM  Result Value Ref Range   Sodium 138 135 - 145 mmol/L   Potassium 4.3 3.5 - 5.1 mmol/L    Comment: DELTA CHECK NOTED REPEATED TO VERIFY SLIGHT HEMOLYSIS    Chloride 111 101 - 111 mmol/L   CO2 19 (L) 22 - 32 mmol/L   Glucose, Bld 78 65 - 99 mg/dL   BUN 17 6 - 20 mg/dL   Creatinine, Ser 1.11 0.61 - 1.24 mg/dL   Calcium 8.2 (L) 8.9 - 10.3 mg/dL   GFR calc non Af Amer 56 (L) >60 mL/min   GFR calc Af Amer >60 >60 mL/min    Comment: (NOTE) The eGFR has been calculated using the CKD EPI equation. This calculation has not been validated in all clinical situations. eGFR's persistently <60 mL/min signify possible Chronic Kidney Disease.    Anion gap 8 5 - 15  CBC     Status: Abnormal   Collection Time: 02/16/16  3:44 AM  Result Value Ref Range   WBC 4.1 4.0 - 10.5 K/uL   RBC 3.61 (L) 4.22 - 5.81 MIL/uL   Hemoglobin 11.0 (L) 13.0 - 17.0 g/dL   HCT 33.9 (L) 39.0 - 52.0 %   MCV 93.9 78.0 - 100.0 fL   MCH 30.5 26.0 - 34.0 pg   MCHC 32.4 30.0 - 36.0 g/dL   RDW 15.0 11.5 - 15.5 %   Platelets 155 150 - 400 K/uL  Protime-INR     Status: Abnormal   Collection Time: 02/16/16  3:44 AM  Result Value Ref Range   Prothrombin Time 23.5 (H) 11.6 - 15.2 seconds   INR 2.20 (H) 0.00 - 1.49  Vitamin B12     Status: Abnormal   Collection Time: 02/16/16  8:11 AM  Result Value Ref Range   Vitamin B-12 143 (L) 180 - 914 pg/mL    Comment: (NOTE) This assay is not validated for testing neonatal or myeloproliferative syndrome specimens for Vitamin B12 levels. Performed at [facility]   HIV antibody     Status: None   Collection Time: 02/16/16  8:11 AM  Result Value Ref Range   HIV Screen 4th Generation wRfx Non Reactive Non Reactive    Comment: (NOTE) Performed At: [facility] [place], [place] [id number] Lindon Romp MD  JT:[id number]   RPR     Status: None   Collection Time: 02/16/16  8:11 AM  Result Value Ref Range   RPR Ser Ql Non Reactive Non Reactive    Comment: (NOTE) Performed At: [facility] [place], [place] [id number] Lindon Romp MD MA:[id number]    Dg Eye Foreign Body  02/16/2016  CLINICAL DATA:  Metal working/exposure; clearance prior to MRI EXAM: ORBITS FOR FOREIGN BODY - 2 VIEW COMPARISON:  None. FINDINGS: There is no evidence of metallic foreign body within the orbits. No significant bone abnormality identified. IMPRESSION: No evidence of metallic foreign body within the orbits. Electronically Signed   By: Claudie Revering M.D.   On: 02/16/2016 13:37   Dg Chest 2 View  02/15/2016  CLINICAL DATA:  Worsening confusion.  Altered mental status. EXAM: CHEST  2 VIEW COMPARISON:  10/23/2013 FINDINGS: Mild cardiomegaly. There is prominence of soft tissue in the right paratracheal region, not definitively seen on prior study. No focal airspace opacities or edema. No effusions or acute bony abnormality. IMPRESSION: Mild cardiomegaly. Mild prominence of right paratracheal soft tissue. I favor this is vascular or possibly related to goiter. Mediastinal adenopathy  cannot be completely excluded. This could be further evaluated with chest CT with IV contrast if felt clinically indicated. Electronically Signed   By: Rolm Baptise M.D.   On: 02/15/2016 11:42   Ct Head Wo Contrast  02/15/2016  CLINICAL DATA:  Recent fall with altered level of consciousness EXAM: CT HEAD WITHOUT CONTRAST TECHNIQUE: Contiguous axial images were obtained from the base of the skull through the vertex without intravenous contrast. COMPARISON:  07/12/2004 FINDINGS: Several motion artifact is identified limiting the exam. Mild atrophic changes are noted. No findings to suggest acute hemorrhage, acute infarction or space-occupying mass lesion are noted. IMPRESSION: Mild atrophic changes without acute abnormality.  Electronically Signed   By: Inez Catalina M.D.   On: 02/15/2016 11:53   Mr Brain Wo Contrast  02/16/2016  CLINICAL DATA:  Acute encephalopathy.  Atrial fib on Coumadin EXAM: MRI HEAD WITHOUT CONTRAST TECHNIQUE: Multiplanar, multiecho pulse sequences of the brain and surrounding structures were obtained without intravenous contrast. COMPARISON:  CT 02/15/2016 FINDINGS: Moderate to advanced cerebral atrophy.  Negative for hydrocephalus. Image quality degraded by motion. Multiple areas of acute infarction. This small area of acute infarct in the right lateral cerebellum. Acute infarct left superior cerebellum. Patchy areas of acute infarct in the occipital lobes bilaterally. Acute infarct left thalamus and right internal capsule. No significant chronic ischemic change. Negative for hemorrhage or fluid collection Negative for mass or edema.  No shift of the midline structures Minimal mucosal edema in the paranasal sinuses. Normal orbital contents. Pituitary not enlarged. IMPRESSION: Image quality degraded by motion Multiple areas of acute infarct. These are in the posterior circulation and include the cerebellum bilaterally. Bilateral occipital lobes, and left thalamus. Possible acute infarct also in the right internal capsule. Moderate to advanced atrophy Electronically Signed   By: Franchot Gallo M.D.   On: 02/16/2016 14:24    Assessment: 82 y.o. male with multiple risk factors for stroke presenting with altered mental status and multiple posterior circulation distribution bilateral infarcts, likely of arterial source.  Stroke Risk Factors - atrial fibrillation, hyperlipidemia and hypertension  Plan: 1. HgbA1c, fasting lipid panel 2. MRA  of the brain without contrast 3. PT consult, OT consult 4. Echocardiogram 5. Carotid dopplers 6. Prophylactic therapy-Anticoagulation: Coumadin 7. Risk factor modification 8. Telemetry monitoring  C.R. Nicole Kindred, MD [facility] [phone]  02/17/2016,  1:35 AM

## 2016-02-17 NOTE — Progress Notes (Signed)
Patient ID: Craig Richard, male   DOB: 07-23-1922, 82 y.o.   MRN: [id number] The patient has recently arrived from [facility] to the neurology unit at [facility]. The report was given to Dr. Wallie Char who will evaluate the patient.

## 2016-02-17 NOTE — Progress Notes (Signed)
STROKE TEAM PROGRESS NOTE   HISTORY OF PRESENT ILLNESS (per record) Craig Richard is an 82 y.o. male with a history of atrial fibrillation on anticoagulation, hypertension, hyperlipidemia and coronary artery disease, admitted to [facility] on 02/15/2016 for acute altered mental status with confusion. Patient reportedly is usually of sound mind and independent and able to care for himself. CT scan on 02/15/2016 was unremarkable for acute changes. MRI on 02/16/2016 showed multiple areas of acute infarction involving cerebellum as well as right and left occipital regions. Patient has not experienced visual changes nor vertigo or nausea. He is on Coumadin and his INR on admission was 1.88. MRI on 02/16/2016 was 2.20. He had an EEG on 02/16/2016 which was normal. NIH stroke score at the time of this evaluation was 1. He was LKW 02/14/2016. Patient was not administered IV t-PA secondary to unclear exactly when he was last known well; no clear focal deficit. He was admitted for further evaluation and treatment.   SUBJECTIVE (INTERVAL HISTORY) No family is at the bedside. He is sitting up in the chair. Confused. Thinks it is 1900 and that Craig Richard is president.  Dr. Leonie Man called and discussed with his daughter Craig Richard.   OBJECTIVE Temp:  [97.7 F (36.5 C)-98.2 F (36.8 C)] 97.8 F (36.6 C) (06/30 0820) Pulse Rate:  [66-77] 72 (06/30 0820) Cardiac Rhythm:  [-]  Resp:  [16-20] 17 (06/30 0820) BP: (97-133)/(59-93) 133/93 mmHg (06/30 0820) SpO2:  [97 %-100 %] 100 % (06/30 0820) Weight:  [68.8 kg (151 lb 10.8 oz)] 68.8 kg (151 lb 10.8 oz) (06/29 2351)  CBC:  Recent Labs Lab 02/15/16 1108 02/16/16 0344  WBC 5.9 4.1  NEUTROABS 4.6  --   HGB 11.3* 11.0*  HCT 34.3* 33.9*  MCV 92.5 93.9  PLT 202 [id number]    Basic Metabolic Panel:  Recent Labs Lab 02/15/16 1152 02/16/16 0344  NA 136 138  K 3.2* 4.3  CL 103 111  CO2 23 19*  GLUCOSE 94 78  BUN 19 17  CREATININE 1.34* 1.11  CALCIUM 8.6*  8.2*    Lipid Panel:    Component Value Date/Time   CHOL 106 02/17/2016 0627   TRIG 50 02/17/2016 0627   HDL 32* 02/17/2016 0627   CHOLHDL 3.3 02/17/2016 0627   VLDL 10 02/17/2016 0627   LDLCALC 64 02/17/2016 0627   HgbA1c: No results found for: HGBA1C Urine Drug Screen: No results found for: LABOPIA, COCAINSCRNUR, LABBENZ, AMPHETMU, THCU, LABBARB    IMAGING  Dg Eye Foreign Body  02/16/2016  CLINICAL DATA:  Metal working/exposure; clearance prior to MRI EXAM: ORBITS FOR FOREIGN BODY - 2 VIEW COMPARISON:  None. FINDINGS: There is no evidence of metallic foreign body within the orbits. No significant bone abnormality identified. IMPRESSION: No evidence of metallic foreign body within the orbits. Electronically Signed   By: Claudie Revering M.D.   On: 02/16/2016 13:37   Mr Brain Wo Contrast  02/16/2016  CLINICAL DATA:  Acute encephalopathy.  Atrial fib on Coumadin EXAM: MRI HEAD WITHOUT CONTRAST TECHNIQUE: Multiplanar, multiecho pulse sequences of the brain and surrounding structures were obtained without intravenous contrast. COMPARISON:  CT 02/15/2016 FINDINGS: Moderate to advanced cerebral atrophy.  Negative for hydrocephalus. Image quality degraded by motion. Multiple areas of acute infarction. This small area of acute infarct in the right lateral cerebellum. Acute infarct left superior cerebellum. Patchy areas of acute infarct in the occipital lobes bilaterally. Acute infarct left thalamus and right internal capsule. No significant chronic ischemic  change. Negative for hemorrhage or fluid collection Negative for mass or edema.  No shift of the midline structures Minimal mucosal edema in the paranasal sinuses. Normal orbital contents. Pituitary not enlarged. IMPRESSION: Image quality degraded by motion Multiple areas of acute infarct. These are in the posterior circulation and include the cerebellum bilaterally. Bilateral occipital lobes, and left thalamus. Possible acute infarct also in the right  internal capsule. Moderate to advanced atrophy Electronically Signed   By: Franchot Gallo M.D.   On: 02/16/2016 14:24   EEG This is a normal EEG for the patients stated age. There were no focal, hemispheric, or lateralizing features. No epileptiform activity was recorded.    PHYSICAL EXAM Frail elderly male not in distress. . Afebrile. Head is nontraumatic. Neck is supple without bruit.    Cardiac exam no murmur or gallop irregular heart sounds. Lungs are clear to auscultation. Distal pulses are well felt. Neurological Exam ;  Awake  Alert oriented x 1. Diminished attention, registration and recall. Poor insight into his condition. Follows only simple one-step commands. Normal speech and language.eye movements full without nystagmus.fundi were not visualized. Vision acuity and fields appear normal. Hearing is normal. Palatal movements are normal. Face symmetric. Tongue midline. Normal strength, tone, reflexes and coordination. Normal sensation. Gait deferred. ASSESSMENT/PLAN Mr. Craig Richard is a 82 y.o. male with history of atrial fibrillation on anticoagulation, hypertension, hyperlipidemia and coronary artery disease presenting with new onset confusion. He did not receive IV t-PA due to unclear LKW, no clear focal deficit.   Stroke:  Posterior circulation embolic infarcts secondary to known atrial fibrillation in setting of subtherapeutic INR  Resultant  Remains confused  MRI  B posterior circulation - B cerebellar, occipital and L thalamic infarcts, possible R internal capsule infarct.  MRA  pending   Carotid Doppler  pending   2D Echo  pending   EEG normal  LDL 64  HgbA1c pending  Warfarin for VTE prophylaxis  Diet Heart Room service appropriate?: Yes; Fluid consistency:: Thin  warfarin daily prior to admission, now on warfarin daily. Changed to eliquis - see below  Patient counseled to be compliant with his antithrombotic medications  Ongoing aggressive stroke  risk factor management  Therapy recommendations:  HH PT  Disposition:  Anticipate return home  Atrial Fibrillation  Home anticoagulation:  warfarin daily continued in the hospital  INR 1.88 on admission  Given stroke on subtherapeutic coumadin, recommend change to NOAC. Dr. Leonie Man discussed with patient as well as with his daughter, Craig Richard, over the telephone. She is agreeable to change. pharmacy consulted ot assist with transition to eliquis   Hypertension  Stable  Permissive hypertension (OK if < 220/120) but gradually normalize in 5-7 days  Long-term BP goal normotensive  Hyperlipidemia  Home meds:  lipitor 40, resumed in hospital  LDL 64, goal < 70  Continue statin at discharge  Other Stroke Risk Factors  Advanced age  Family hx stroke (mother)  Coronary artery disease - MI  Other Active Problems  hypokalemia  Hospital day # 0  Radene [facility] [place] for Pager information 02/17/2016 11:51 AM  I have personally examined this patient, reviewed notes, independently viewed imaging studies, participated in medical decision making and plan of care. I have made any additions or clarifications directly to the above note. Agree with note above.  He presented with altered mental status and confusion due to bilateral embolic posterior circulation infarcts despite being on anticoagulation with warfarin but suboptimal INR  changes are of 1.88. Patient remains at risk for neurological worsening, recurrent stroke, TIA. and bleeding I had a long discussion  with the patient's daughter over the phone regarding his risk for recurrent strokes and risk reduction with anticoagulation and alternative medications to warfarin. After discussing risks and benefits and bleeding risk carefully I recommend changing warfarin to eliquis for long-term anticoagulation and she is in agreement. I suspect patient has mild age-related dementia at baseline and compliance with warfarin  going forward also may be an issue Greater than 50% time during this 35 minute visit was spent on counseling and coordination of care about stroke risk, prevention, treatment Antony Contras, MD Medical Director [place] Pager: [phone] 02/17/2016 4:40 PM    To contact Stroke Continuity provider, please refer to [email or web address] After hours, contact General Neurology

## 2016-02-17 NOTE — Discharge Instructions (Addendum)
You have been cared for in the hospital for a stroke.  Instructions are as follows: 1. Stroke - some improvement in functioning/confusion during hospitalization.  This is expected to slowly improve some over time, although it is not clear whether it will go away completely. -Your hemoglobin A1c was minimally elevated.  Given your age, you do  Not require treatment for diabetes at this time. -Your LDL (bad cholesterol) is at goal; continue taking Lipitor.  Your HDL (good cholesterol) is low; you could add fish oil if desired. -You were taking Coumadin but this has been changed to Eliquis.  See medication information below. -Physical/occupation/speech therapy all recommend home services. -Other equipment for home use has also been ordered (rolling walker, 3 in 1 commode, tub seat, wheelchair). -You are recommended to have 24 hour supervision.  2. 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  Information on my medicine - ELIQUIS (apixaban)  This medication education was reviewed with me or my healthcare representative as part of my discharge preparation.  The pharmacist that spoke with me during my hospital stay was:  Arty Baumgartner, [facility]  Why was Eliquis prescribed for you? Eliquis was prescribed for you to reduce the risk of a blood clot forming that can cause a stroke if you have a medical condition called atrial fibrillation (a type of irregular heartbeat).  What do You need to know about Eliquis ? Take your Eliquis TWICE DAILY - one tablet in the morning and one tablet in the evening with or without food. If you have difficulty swallowing the tablet whole please discuss with your pharmacist how to take the medication safely.  Take Eliquis exactly as prescribed by your doctor and DO NOT stop taking Eliquis without talking to the doctor who prescribed the medication.  Stopping may increase your risk  of developing a stroke.  Refill your prescription before you run out.  After discharge, you should have regular check-up appointments with your healthcare provider that is prescribing your Eliquis.  In the future your dose may need to be changed if your kidney function or weight changes by a significant amount or as you get older.  What do you do if you miss a dose? If you miss a dose, take it as soon as you remember on the same day and resume taking twice daily.  Do not take more than one dose of ELIQUIS at the same time to make up a missed dose.  Important Safety Information A possible side effect of Eliquis is bleeding. You should call your healthcare provider right away if you experience any of the following: ? Bleeding from an injury or your nose that does not stop. ? Unusual colored urine (red or dark brown) or unusual colored stools (red or black). ? Unusual bruising for unknown reasons. ? A serious fall or if you hit your head (even if there is no bleeding).  Some medicines may interact with Eliquis and might increase your risk of bleeding or clotting while on Eliquis. To help avoid this, consult your healthcare provider or pharmacist prior to using any new prescription or non-prescription medications, including herbals, vitamins, non-steroidal anti-inflammatory drugs (NSAIDs) and supplements.  This website has more information on Eliquis (apixaban): http://www.eliquis.com/eliquis/home

## 2016-02-17 NOTE — Care Management Note (Signed)
Case Management Note  Patient Details  Name: Craig Richard MRN: [id number] Date of Birth: November 10, 1921  Subjective/Objective:     Pt admitted with CVA. He is from home alone.                Action/Plan: Pt with orders for wheelchair and a walker. CM met with the patient and he states he does not need this equipment. He says he has a wheelchair and walker at home. CM will continue to follow for d/c needs.   Expected Discharge Date:   (unknown)               Expected Discharge Plan:  [place]  In-House Referral:     Discharge planning Services  CM Consult  Post Acute Care Choice:  Home Health Choice offered to:  Patient, Adult Children  DME Arranged:  Walker rolling, Wheelchair manual DME Agency:  [place]:  PT, Nurse's Aide [place] Agency:  [facility]  Status of Service:  In process, will continue to follow  If discussed at Long Length of Stay Meetings, dates discussed:    Additional Comments:  Pollie Friar, RN 02/17/2016, 3:58 PM

## 2016-02-17 NOTE — Progress Notes (Signed)
Physical Therapy Treatment Patient Details Name: Craig Richard MRN: [id number] DOB: August 11, 1922 Today's Date: 02/17/2016    History of Present Illness 82 y.o. male with medical history significant for Afib on coumadin, and prior CAD who is being admitted due to delirium. Pt's daughter was present in ER and provided history, he is very independent, lives alone, cooks, performs own ADLs and still drives    PT Comments    Patient progressing slowly towards PT goals. Continues to require RW for support due to unsteadiness and impaired safety awareness. Eager to exercise. Min A to stand initially progressing to Min guard with cues for technique. Will follow.   Follow Up Recommendations  Home health PT;Supervision/Assistance - 24 hour     Equipment Recommendations  Rolling walker with 5" wheels    Recommendations for Other Services       Precautions / Restrictions Precautions Precautions: Fall Restrictions Weight Bearing Restrictions: No    Mobility  Bed Mobility Overal bed mobility: Modified Independent             General bed mobility comments: No assist needed. Use of rail.  Transfers Overall transfer level: Needs assistance Equipment used: None Transfers: Sit to/from Stand Sit to Stand: Min assist         General transfer comment: Min A to boost from EOB with steadying assist once up- reaching for UE support upon standing, some difficulty.   Ambulation/Gait Ambulation/Gait assistance: Min assist Ambulation Distance (Feet): 200 Feet Assistive device: Rolling walker (2 wheeled) Gait Pattern/deviations: Step-through pattern;Decreased stride length;Trunk flexed Gait velocity: decreased   General Gait Details: Cues for RW proximity as pt with RW too far anterior; handheld assist but progressed to RW. Difficulty with turns.    Stairs            Wheelchair Mobility    Modified Rankin (Stroke Patients Only) Modified Rankin (Stroke Patients  Only) Pre-Morbid Rankin Score: Slight disability Modified Rankin: Moderately severe disability     Balance Overall balance assessment: Needs assistance;History of Falls Sitting-balance support: Feet supported;No upper extremity supported Sitting balance-Leahy Scale: Good     Standing balance support: During functional activity Standing balance-Leahy Scale: Poor Standing balance comment: Requires UE support in standing.                    Cognition Arousal/Alertness: Awake/alert Behavior During Therapy: WFL for tasks assessed/performed Overall Cognitive Status: No family/caregiver present to determine baseline cognitive functioning Area of Impairment: Orientation Orientation Level: Disoriented to;Place;Time;Situation                  Exercises Other Exercises Other Exercises: Sit to stand x6 with cues for slow descent.    General Comments        Pertinent Vitals/Pain Pain Assessment: No/denies pain    Home Living                      Prior Function            PT Goals (current goals can now be found in the care plan section) Progress towards PT goals: Progressing toward goals    Frequency  Min 3X/week    PT Plan Current plan remains appropriate    Co-evaluation             End of Session Equipment Utilized During Treatment: Gait belt Activity Tolerance: Patient tolerated treatment well Patient left: in chair;with call bell/phone within reach;with chair alarm set     Time:  [id number] PT Time Calculation (min) (ACUTE ONLY): 20 min  Charges:  $Gait Training: 8-22 mins                    G Codes:      Katelind Pytel A Adil Tugwell 02/17/2016, 2:09 PM Wray Kearns, [place], DPT [phone]

## 2016-02-17 NOTE — Evaluation (Signed)
SLP Cancellation Note  Patient Details Name: KYLIAN STUCHELL MRN: [id number] DOB: [birth year]   Cancelled treatment:       Reason Eval/Treat Not Completed: Other (comment) (pt passed an RNSSS; please reorder if indicated)  Luanna Salk, [facility] SLP [phone]

## 2016-02-17 NOTE — Progress Notes (Signed)
[place] for Eliquis Indication: atrial fibrillation and stroke  No Known Allergies  Patient Measurements: Height: 6' (182.9 cm) Weight: 151 lb 10.8 oz (68.8 kg) IBW/kg (Calculated) : 77.6  Labs:  Recent Labs  02/15/16 1108 02/15/16 1152 02/16/16 0344 02/16/16 1446 02/17/16 1327  HGB 11.3*  --  11.0*  --  11.2*  HCT 34.3*  --  33.9* 31.5* 36.3*  PLT 202  --  155  --  180  LABPROT  --  20.9* 23.5*  --  20.5*  INR  --  1.88* 2.20*  --  1.76*  CREATININE  --  1.34* 1.11  --  1.37*    Estimated Creatinine Clearance: 33.5 mL/min (by C-G formula based on Cr of 1.37).  Assessment:   INR below target range again today, and transitioning from Coumadin to Eliquis.   Low-dose Eliquis per Dr. Leonie Man. Orders already in place.  First dose due tonight, as INR is less than 2.   Discussed with patient's daughter, Amy.  Goal of Therapy:  Therapeutic anticoagulation  Monitor platelets by anticoagulation protocol: Yes   Plan:   Eliquis 2.5 mg BID.  Intermittent CBC.  Kelvin Cellar [place] Pager: [phone] 02/17/2016,8:52 PM

## 2016-02-17 NOTE — Progress Notes (Signed)
OT Cancellation Note  Patient Details Name: Craig Richard MRN: [id number] DOB: Feb 13, 1922   Cancelled Treatment:    Reason Eval/Treat Not Completed: Patient at procedure or test/ unavailable (MRI). Will follow up for OT eval as time allows.  Binnie Kand M.S., OTR/L Pager: [phone]  02/17/2016, 3:00 PM

## 2016-02-17 NOTE — Progress Notes (Signed)
PROGRESS NOTE    Craig Richard  [id number] DOB: [birth year] DOA: 02/15/2016 PCP: [name], MD  Outpatient Specialists:     Brief Narrative:  82 year old independent male living alone history of A. fib on Coumadin, hypertension, hyperlipidemia and prior history of coronary artery disease admitted with acute delirium. Patient was completely normal 1 day prior to admission. On the morning of admission daughter found him asleep on the floor and had urinated in a dresser drawer. He was very confused and unable to walk properly to the bathroom. He tried to urinate but missed the commode and fell hitting his back onto the tub. Did not hit his head or lost consciousness. EMS was called and patient brought to the ED. In the ED his head CT was unremarkable, UA and chest x-ray negative for infection. Labs were unremarkable except for mild hypokalemia. Admitted for further workup.   Assessment & Plan:   Principal Problem:   Acute cerebrovascular accident (CVA) ([place]) Active Problems:   CAD in native artery, wth hx ant MI, wth PTCA to LAD, 2010   Atrial fibrillation (HCC)   Altered level of consciousness   Delirium   Acute CVA, diffuse lesions throughout the posterior circulation -Needs risk factor control - afib, HTN, HLD -HgbA1c pending -fasting lipid panel - LDL at goal, HDL is low, could add fish oil 2. MRA of the brain without contrast :  Abnormal appearance of the left internal carotid artery petrous segment and supraclinoid segment with significant narrowing which may reflect result of dissection and/ or atherosclerotic disease.   High-grade (almost completely occluding) stenosis at the junction of the left internal carotid artery cavernous and supraclinoid segment.   Decreased signal intensity of left carotid bifurcation and left middle cerebral artery may indicate that flow to this region is inadequate secondary to the proximal stenosis.   Moderate plaque right  internal carotid artery petrous segment with mild narrowing.   Mild to moderate focal narrowing mid aspect M1 segment right middle cerebral artery.   Mild narrowing left vertebral artery.   Mild irregularity and narrowing of portions of the posterior inferior cerebellar artery bilaterally.   Mild irregularity basilar artery without high-grade stenosis.   Nonvisualized anterior inferior cerebellar arteries.   Moderate narrowing right superior cerebellar artery. -Discussed with Dr. Leonie Man - he thinks this is chronic and incidental and recommends changing from Coumadin to Eliquis.  Not a candidate for further intervention.   -PT consult - recommends home health PT, a rolling walker, and 24 hour supervision -OT consult pending -speech therapy pending -Echocardiogram  pending -Carotid dopplers pending -Prophylactic therapy-Anticoagulation: Coumadin -Risk factor modification -Telemetry monitoring -TSH, HIV antibody and RPR normal.   B 12 deficiency Replete and maintain on replacement therapy  A. fib Rate controlled. On Coumadin with therapeutic INR.  Patient's daughter clearly states that patient has specifically stated in the past that he would rather bleed to death on anticoagulant than to have a serious stroke that leaves him debilitated.  They would like to continue anticoagulation.  Will change to Eliquis for ease in dosing, 2.5 mg daily as per Dr. Leonie Man.  Pharmacy to dose.  Hypokalemia Replenish  Essential hypertension Continue home medications  CAD Continue beta blocker and statin      Code Status : DNR  Family Communication : Daughter at bedside  Disposition Plan : Home with home health possibly in the next 24 hours if workup completed and symptoms are better; may require rehab placement - will reassess in AM  Barriers For Discharge : Currently not recognizing toilet consistently  Consults : None  Procedures : EEG  DVT Prophylaxis : Coumadin,  changing to Eliquis          Antibiotics :   Anti-infectives    None          Subjective: Patient without complaint today.  Was comfortable and pleasant this am despite confusion.  Objective: Filed Vitals:   02/17/16 0139 02/17/16 0521 02/17/16 0820 02/17/16 1358  BP: 112/74 120/80 133/93 111/70  Pulse: 73 66 72 69  Temp: 98 F (36.7 C) 97.7 F (36.5 C) 97.8 F (36.6 C) 97.5 F (36.4 C)  TempSrc: Oral Oral Oral Oral  Resp: 16 16 17 16   Height:      Weight:      SpO2: 99% 98% 100% 99%    Intake/Output Summary (Last 24 hours) at 02/17/16 1704 Last data filed at 02/17/16 0530  Gross per 24 hour  Intake    325 ml  Output    300 ml  Net     25 ml   Filed Weights   02/16/16 2351  Weight: 68.8 kg (151 lb 10.8 oz)    Examination:  General exam: Appears calm and comfortable; alert and oriented to person but not place or time; not bothered by lack of orientation and reading newspaper in NAD Respiratory system: Clear to auscultation. Respiratory effort normal. Cardiovascular system: S1 & S2 heard, RRR. No JVD, murmurs, rubs, gallops or clicks. No pedal edema. Gastrointestinal system: Abdomen is nondistended, soft and nontender. No organomegaly or masses felt. Normal bowel sounds heard. Central nervous system: Alert and oriented. No focal neurological deficits. Extremities: Symmetric 5 x 5 power. Skin: No rashes, lesions or ulcers Psychiatry: Mood & affect appropriate.     Data Reviewed: I have personally reviewed following labs and imaging studies  CBC:  Recent Labs Lab 02/15/16 1108 02/16/16 0344 02/16/16 1446 02/17/16 1327  WBC 5.9 4.1  --  4.9  NEUTROABS 4.6  --   --   --   HGB 11.3* 11.0*  --  11.2*  HCT 34.3* 33.9* 31.5* 36.3*  MCV 92.5 93.9  --  95.8  PLT 202 155  --  99991111   Basic Metabolic Panel:  Recent Labs Lab 02/15/16 1152 02/16/16 0344 02/17/16 1327  NA 136 138 139  K 3.2* 4.3 3.8  CL 103 111 106  CO2 23 19* 26  GLUCOSE  94 78 158*  BUN 19 17 21*  CREATININE 1.34* 1.11 1.37*  CALCIUM 8.6* 8.2* 9.0   GFR: Estimated Creatinine Clearance: 33.5 mL/min (by C-G formula based on Cr of 1.37). Liver Function Tests:  Recent Labs Lab 02/15/16 1152  AST 15  ALT 8*  ALKPHOS 73  BILITOT 1.2  PROT 6.5  ALBUMIN 4.0   No results for input(s): LIPASE, AMYLASE in the last 168 hours. No results for input(s): AMMONIA in the last 168 hours. Coagulation Profile:  Recent Labs Lab 02/15/16 1152 02/16/16 0344 02/17/16 1327  INR 1.88* 2.20* 1.76*   Cardiac Enzymes: No results for input(s): CKTOTAL, CKMB, CKMBINDEX, TROPONINI in the last 168 hours. BNP (last 3 results) No results for input(s): PROBNP in the last 8760 hours. HbA1C: No results for input(s): HGBA1C in the last 72 hours. CBG: No results for input(s): GLUCAP in the last 168 hours. Lipid Profile:  Recent Labs  02/17/16 0627  CHOL 106  HDL 32*  LDLCALC 64  TRIG 50  CHOLHDL 3.3   Thyroid Function  Tests:  Recent Labs  02/15/16 1234  TSH 1.955  FREET4 0.87   Anemia Panel:  Recent Labs  02/16/16 0811  VITAMINB12 143*   Urine analysis:    Component Value Date/Time   COLORURINE YELLOW 02/15/2016 [place] 02/15/2016 1354   LABSPEC 1.015 02/15/2016 1354   PHURINE 7.0 02/15/2016 1354   GLUCOSEU NEGATIVE 02/15/2016 1354   HGBUR SMALL* 02/15/2016 1354   BILIRUBINUR NEGATIVE 02/15/2016 1354   KETONESUR 15* 02/15/2016 1354   PROTEINUR 30* 02/15/2016 1354   UROBILINOGEN 0.2 09/23/2007 0821   NITRITE NEGATIVE 02/15/2016 1354   LEUKOCYTESUR NEGATIVE 02/15/2016 1354   Sepsis Labs: @LABRCNTIP (procalcitonin:4,lacticidven:4)  )No results found for this or any previous visit (from the past 240 hour(s)).       Radiology Studies: Dg Eye Foreign Body  02/16/2016  CLINICAL DATA:  Metal working/exposure; clearance prior to MRI EXAM: ORBITS FOR FOREIGN BODY - 2 VIEW COMPARISON:  None. FINDINGS: There is no evidence of  metallic foreign body within the orbits. No significant bone abnormality identified. IMPRESSION: No evidence of metallic foreign body within the orbits. Electronically Signed   By: Claudie Revering M.D.   On: 02/16/2016 13:37   Mr Virgel Paling Wo Contrast  02/17/2016  CLINICAL DATA:  82 year old hypertensive male with altered mental status. Atrial fibrillation on Coumadin. Recent bilateral acute infarcts noted. Subsequent encounter. EXAM: MRA HEAD WITHOUT CONTRAST TECHNIQUE: Angiographic images of the Circle of Willis were obtained using MRA technique without intravenous contrast. COMPARISON:  02/16/2016 brain MR. 02/15/2016 CT. FINDINGS: Abnormal appearance of the left internal carotid artery petrous segment and supraclinoid segment with significant narrowing which may reflect result of dissection and/ or atherosclerotic disease. Flow was seen within the left internal carotid artery cavernous segment however there is a high grade (almost completely occluding) stenosis at the junction of the left internal carotid artery cavernous and supraclinoid segment. Decreased signal intensity of left carotid bifurcation and left middle cerebral artery may indicate that flow to this region is inadequate secondary to the proximal stenosis. Moderate plaque right internal carotid artery petrous segment with mild narrowing. Mild to moderate focal narrowing mid aspect M1 segment right middle cerebral artery. Mild narrowing left vertebral artery. Mild irregularity and narrowing of portions of the posterior inferior cerebellar artery bilaterally. Mild irregularity basilar artery without high-grade stenosis. Nonvisualized anterior inferior cerebellar arteries. Moderate narrowing right superior cerebellar artery. Minimal deposition, laminar necrosis or blood breakdown products right globus pallidus unchanged. IMPRESSION: Abnormal appearance of the left internal carotid artery petrous segment and supraclinoid segment with significant  narrowing which may reflect result of dissection and/ or atherosclerotic disease. High-grade (almost completely occluding) stenosis at the junction of the left internal carotid artery cavernous and supraclinoid segment. Decreased signal intensity of left carotid bifurcation and left middle cerebral artery may indicate that flow to this region is inadequate secondary to the proximal stenosis. Moderate plaque right internal carotid artery petrous segment with mild narrowing. Mild to moderate focal narrowing mid aspect M1 segment right middle cerebral artery. Mild narrowing left vertebral artery. Mild irregularity and narrowing of portions of the posterior inferior cerebellar artery bilaterally. Mild irregularity basilar artery without high-grade stenosis. Nonvisualized anterior inferior cerebellar arteries. Moderate narrowing right superior cerebellar artery. Electronically Signed   By: Genia Del M.D.   On: 02/17/2016 16:01   Mr Brain Wo Contrast  02/16/2016  CLINICAL DATA:  Acute encephalopathy.  Atrial fib on Coumadin EXAM: MRI HEAD WITHOUT CONTRAST TECHNIQUE: Multiplanar, multiecho pulse sequences of the  brain and surrounding structures were obtained without intravenous contrast. COMPARISON:  CT 02/15/2016 FINDINGS: Moderate to advanced cerebral atrophy.  Negative for hydrocephalus. Image quality degraded by motion. Multiple areas of acute infarction. This small area of acute infarct in the right lateral cerebellum. Acute infarct left superior cerebellum. Patchy areas of acute infarct in the occipital lobes bilaterally. Acute infarct left thalamus and right internal capsule. No significant chronic ischemic change. Negative for hemorrhage or fluid collection Negative for mass or edema.  No shift of the midline structures Minimal mucosal edema in the paranasal sinuses. Normal orbital contents. Pituitary not enlarged. IMPRESSION: Image quality degraded by motion Multiple areas of acute infarct. These are in the  posterior circulation and include the cerebellum bilaterally. Bilateral occipital lobes, and left thalamus. Possible acute infarct also in the right internal capsule. Moderate to advanced atrophy Electronically Signed   By: Franchot Gallo M.D.   On: 02/16/2016 14:24        Scheduled Meds: . apixaban  2.5 mg Oral BID  . [START ON 02/18/2016] atorvastatin  80 mg Oral QODAY  . feeding supplement (ENSURE ENLIVE)  237 mL Oral BID BM  . isosorbide mononitrate  60 mg Oral QAC breakfast  . metoprolol  75 mg Oral BID  . multivitamin with minerals  1 tablet Oral Daily  . vitamin B-12  1,000 mcg Oral Daily   Continuous Infusions: . sodium chloride 75 mL/hr at 02/17/16 0110     LOS: 0 days    Time spent: 35 minutes    Karmen Bongo, MD [facility]   If 7PM-7AM, please contact night-coverage [email or web address] Password [email or web address] 02/17/2016, 5:04 PM

## 2016-02-18 ENCOUNTER — Encounter (HOSPITAL_COMMUNITY): Payer: Medicare Other

## 2016-02-18 DIAGNOSIS — I639 Cerebral infarction, unspecified: Principal | ICD-10-CM

## 2016-02-18 LAB — CBC
HCT: 33.4 % — ABNORMAL LOW (ref 39.0–52.0)
HEMOGLOBIN: 10.5 g/dL — AB (ref 13.0–17.0)
MCH: 30.3 pg (ref 26.0–34.0)
MCHC: 31.4 g/dL (ref 30.0–36.0)
MCV: 96.3 fL (ref 78.0–100.0)
Platelets: 146 10*3/uL — ABNORMAL LOW (ref 150–400)
RBC: 3.47 MIL/uL — ABNORMAL LOW (ref 4.22–5.81)
RDW: 15.1 % (ref 11.5–15.5)
WBC: 4.7 10*3/uL (ref 4.0–10.5)

## 2016-02-18 LAB — BASIC METABOLIC PANEL
Anion gap: 8 (ref 5–15)
BUN: 20 mg/dL (ref 6–20)
CHLORIDE: 108 mmol/L (ref 101–111)
CO2: 24 mmol/L (ref 22–32)
CREATININE: 1.18 mg/dL (ref 0.61–1.24)
Calcium: 8.5 mg/dL — ABNORMAL LOW (ref 8.9–10.3)
GFR calc Af Amer: 60 mL/min — ABNORMAL LOW (ref >60)
GFR calc non Af Amer: 52 mL/min — ABNORMAL LOW (ref >60)
GLUCOSE: 101 mg/dL — AB (ref 65–99)
Potassium: 3.7 mmol/L (ref 3.5–5.1)
SODIUM: 140 mmol/L (ref 135–145)

## 2016-02-18 LAB — PROTIME-INR
INR: 1.96 — ABNORMAL HIGH (ref 0.00–1.49)
PROTHROMBIN TIME: 22.2 s — AB (ref 11.6–15.2)

## 2016-02-18 LAB — HEMOGLOBIN A1C
Hgb A1c MFr Bld: 5.7 % — ABNORMAL HIGH (ref 4.8–5.6)
Mean Plasma Glucose: 117 mg/dL

## 2016-02-18 NOTE — Evaluation (Signed)
Occupational Therapy Evaluation Patient Details Name: Craig Richard MRN: [id number] DOB: 06-21-1922 Today's Date: 02/18/2016    History of Present Illness 82 y.o. male with medical history significant for Afib on coumadin, and prior CAD who is being admitted due to delirium. Pt's daughter was present in ER and provided history, he is very independent, lives alone, cooks, performs own ADLs and still drives. MRI on 02/16/2016 showed multiple areas of acute infarction involving cerebellum as well as right and left occipital regions.   Clinical Impression   Pt reports he was independent with ADLs PTA. Currently pt requires min assist overall for ADLs and functional mobility. Pt presenting with unsteadiness on his feet (x3 LOB during functional tasks; mod assist to correct) and impaired cognition affecting his independence and safety with ADLs and functional mobility. At this time, recommending [place] [facility] [place] aide for follow up with 24/7 supervision. Pt would benefit from continued skilled OT to address established goals.    Follow Up Recommendations  Home health OT;Supervision/Assistance - 24 hour;Other (comment) (Tool aide)    Equipment Recommendations  3 in 1 bedside comode;Other (comment) (tub seat vs bench)    Recommendations for Other Services       Precautions / Restrictions Precautions Precautions: Fall Restrictions Weight Bearing Restrictions: No      Mobility Bed Mobility Overal bed mobility: Needs Assistance Bed Mobility: Sit to Supine       Sit to supine: Supervision;HOB elevated   General bed mobility comments: Supervision for safety. HOB elevated wihout use of bed rail.  Transfers Overall transfer level: Needs assistance Equipment used: None Transfers: Sit to/from Stand Sit to Stand: Min assist         General transfer comment: Min assist for balance throughout all transfers.    Balance Overall balance assessment: Needs assistance Sitting-balance support:  Feet supported;No upper extremity supported Sitting balance-Leahy Scale: Good     Standing balance support: No upper extremity supported;During functional activity Standing balance-Leahy Scale: Poor                              ADL Overall ADL's : Needs assistance/impaired Eating/Feeding: Set up;Supervision/ safety;Sitting   Grooming: Minimal assistance;Standing;Wash/dry hands;Oral care Grooming Details (indicate cue type and reason): Min assist for balance in standing Upper Body Bathing: Set up;Min guard;Sitting   Lower Body Bathing: Minimal assistance;Sit to/from stand   Upper Body Dressing : Set up;Min guard;Sitting   Lower Body Dressing: Minimal assistance;Sit to/from stand   Toilet Transfer: Minimal assistance;Ambulation;BSC   Toileting- Clothing Manipulation and Hygiene: Minimal assistance;Sit to/from stand Toileting - Clothing Manipulation Details (indicate cue type and reason): Min assist for balance during clothing manipulation in standing. Cues to pull down boxers prior to sitting on toilet.     Functional mobility during ADLs: Minimal assistance General ADL Comments: Pt very unsteady during mobility without use of AD. Pt able to complete functional activities but requires constant min assist for balance with any standing (static or dynamic) task. Pt impulsive with toilet transfer and reports he feels "unsteady" on his feet. Unable to identify that he had urinated on the bathroom floor.     Vision Additional Comments: Difficult to assess due to cognitive impairments. Able to complete functional activities WFL.   Perception     Praxis      Pertinent Vitals/Pain Pain Assessment: No/denies pain     Hand Dominance Right   Extremity/Trunk Assessment Upper Extremity Assessment Upper Extremity Assessment:  Generalized weakness   Lower Extremity Assessment Lower Extremity Assessment: Defer to PT evaluation   Cervical / Trunk Assessment Cervical /  Trunk Assessment: Kyphotic   Communication Communication Communication: No difficulties   Cognition Arousal/Alertness: Awake/alert Behavior During Therapy: WFL for tasks assessed/performed;Impulsive (slightly impulsive with transfers) Overall Cognitive Status: Impaired/Different from baseline Area of Impairment: Orientation;Memory;Following commands;Safety/judgement Orientation Level: Disoriented to;Place;Time   Memory: Decreased short-term memory Following Commands: Follows multi-step commands inconsistently;Follows one step commands consistently Safety/Judgement: Decreased awareness of safety;Decreased awareness of deficits     General Comments: States he is in [place] but can identify he is at [facility] from sign on wall in room. States he lives with his wife.   General Comments       Exercises       Shoulder Instructions      Home Living Family/patient expects to be discharged to:: Private residence Living Arrangements: Other (Comment) (pt reports he lives with his wife) Available Help at Discharge: Family;Available PRN/intermittently Type of Home: House             Bathroom Shower/Tub: Tub/shower unit Shower/tub characteristics: Curtain Biochemist, clinical: Standard     Home Equipment: Radio producer - single point      Lives With: Alone    Prior Functioning/Environment Level of Independence: Independent with assistive device(s)        Comments: pt reports he uses a cane for community mobility    OT Diagnosis: Generalized weakness;Cognitive deficits;Altered mental status   OT Problem List: Decreased strength;Decreased activity tolerance;Impaired balance (sitting and/or standing);Decreased coordination;Decreased cognition;Decreased safety awareness;Decreased knowledge of use of DME or AE;Decreased knowledge of precautions   OT Treatment/Interventions: Self-care/ADL training;Energy conservation;DME and/or AE instruction;Therapeutic activities;Patient/family  education;Balance training;Cognitive remediation/compensation    OT Goals(Current goals can be found in the care plan section) Acute Rehab OT Goals Patient Stated Goal: none stated OT Goal Formulation: With patient Time For Goal Achievement: 03/03/16 Potential to Achieve Goals: Good ADL Goals Pt Will Perform Grooming: with supervision;standing Pt Will Perform Upper Body Bathing: with supervision;sitting Pt Will Perform Lower Body Bathing: with supervision;sit to/from stand Pt Will Transfer to Toilet: with supervision;ambulating;bedside commode Pt Will Perform Toileting - Clothing Manipulation and hygiene: with supervision;sit to/from stand Pt Will Perform Tub/Shower Transfer: Tub transfer;with supervision;rolling walker;ambulating (?tub seat vs bench) Pt/caregiver will Perform Home Exercise Program: Increased strength;Both right and left upper extremity;With Supervision;With theraband;With written HEP provided  OT Frequency: Min 2X/week   Barriers to D/C:            Co-evaluation              End of Session Equipment Utilized During Treatment: Gait belt Nurse Communication: Mobility status  Activity Tolerance: Patient tolerated treatment well Patient left: in bed;with call bell/phone within reach;with bed alarm set   Time: 1412-1435 OT Time Calculation (min): 23 min Charges:  OT General Charges $OT Visit: 1 Procedure OT Evaluation $OT Eval Moderate Complexity: 1 Procedure OT Treatments $Self Care/Home Management : 8-22 mins G-Codes:     Binnie Kand M.S., OTR/L Pager: [phone]  02/18/2016, 2:48 PM

## 2016-02-18 NOTE — Progress Notes (Signed)
STROKE TEAM PROGRESS NOTE   HISTORY OF PRESENT ILLNESS (per record) Craig Richard is an 82 y.o. male with a history of atrial fibrillation on anticoagulation, hypertension, hyperlipidemia and coronary artery disease, admitted to [facility] on 02/15/2016 for acute altered mental status with confusion. Patient reportedly is usually of sound mind and independent and able to care for himself. CT scan on 02/15/2016 was unremarkable for acute changes. MRI on 02/16/2016 showed multiple areas of acute infarction involving cerebellum as well as right and left occipital regions. Patient has not experienced visual changes nor vertigo or nausea. He is on Coumadin and his INR on admission was 1.88. MRI on 02/16/2016 was 2.20. He had an EEG on 02/16/2016 which was normal. NIH stroke score at the time of this evaluation was 1. He was LKW 02/14/2016. Patient was not administered IV t-PA secondary to unclear exactly when he was last known well; no clear focal deficit. He was admitted for further evaluation and treatment.   SUBJECTIVE (INTERVAL HISTORY) Daughter Craig Richard  is at the bedside. He is sitting up in the chair. Confused.  Long discussion with daughter at the bedside about his risk for recurrent strokes, risk reduction with anticoagulation increased bleeding risk from age. Severe asymptomatic left carotid stenosis and patient not being a candidate for endovascular stenting  OBJECTIVE Temp:  [97.5 F (36.4 C)-98.4 F (36.9 C)] 98.3 F (36.8 C) (07/01 0900) Pulse Rate:  [63-81] 63 (07/01 0900) Cardiac Rhythm:  [-] Atrial fibrillation (07/01 0831) Resp:  [16-18] 18 (07/01 0900) BP: (109-126)/(52-92) 109/69 mmHg (07/01 0900) SpO2:  [97 %-99 %] 98 % (07/01 0900)  CBC:  Recent Labs Lab 02/15/16 1108  02/17/16 1327 02/18/16 0426  WBC 5.9  < > 4.9 4.7  NEUTROABS 4.6  --   --   --   HGB 11.3*  < > 11.2* 10.5*  HCT 34.3*  < > 36.3* 33.4*  MCV 92.5  < > 95.8 96.3  PLT 202  < > 180 146*  < > =  values in this interval not displayed.  Basic Metabolic Panel:   Recent Labs Lab 02/17/16 1327 02/18/16 0426  NA 139 140  K 3.8 3.7  CL 106 108  CO2 26 24  GLUCOSE 158* 101*  BUN 21* 20  CREATININE 1.37* 1.18  CALCIUM 9.0 8.5*    Lipid Panel:     Component Value Date/Time   CHOL 106 02/17/2016 0627   TRIG 50 02/17/2016 0627   HDL 32* 02/17/2016 0627   CHOLHDL 3.3 02/17/2016 0627   VLDL 10 02/17/2016 0627   LDLCALC 64 02/17/2016 0627   HgbA1c:  Lab Results  Component Value Date   HGBA1C 5.7* 02/17/2016   Urine Drug Screen: No results found for: LABOPIA, COCAINSCRNUR, LABBENZ, AMPHETMU, THCU, LABBARB    IMAGING  Dg Eye Foreign Body  02/16/2016  CLINICAL DATA:  Metal working/exposure; clearance prior to MRI EXAM: ORBITS FOR FOREIGN BODY - 2 VIEW COMPARISON:  None. FINDINGS: There is no evidence of metallic foreign body within the orbits. No significant bone abnormality identified. IMPRESSION: No evidence of metallic foreign body within the orbits. Electronically Signed   By: Claudie Revering M.D.   On: 02/16/2016 13:37   Mr Virgel Paling Wo Contrast  02/17/2016  CLINICAL DATA:  82 year old hypertensive male with altered mental status. Atrial fibrillation on Coumadin. Recent bilateral acute infarcts noted. Subsequent encounter. EXAM: MRA HEAD WITHOUT CONTRAST TECHNIQUE: Angiographic images of the Circle of Willis were obtained using MRA technique  without intravenous contrast. COMPARISON:  02/16/2016 brain MR. 02/15/2016 CT. FINDINGS: Abnormal appearance of the left internal carotid artery petrous segment and supraclinoid segment with significant narrowing which may reflect result of dissection and/ or atherosclerotic disease. Flow was seen within the left internal carotid artery cavernous segment however there is a high grade (almost completely occluding) stenosis at the junction of the left internal carotid artery cavernous and supraclinoid segment. Decreased signal intensity of left  carotid bifurcation and left middle cerebral artery may indicate that flow to this region is inadequate secondary to the proximal stenosis. Moderate plaque right internal carotid artery petrous segment with mild narrowing. Mild to moderate focal narrowing mid aspect M1 segment right middle cerebral artery. Mild narrowing left vertebral artery. Mild irregularity and narrowing of portions of the posterior inferior cerebellar artery bilaterally. Mild irregularity basilar artery without high-grade stenosis. Nonvisualized anterior inferior cerebellar arteries. Moderate narrowing right superior cerebellar artery. Minimal deposition, laminar necrosis or blood breakdown products right globus pallidus unchanged. IMPRESSION: Abnormal appearance of the left internal carotid artery petrous segment and supraclinoid segment with significant narrowing which may reflect result of dissection and/ or atherosclerotic disease. High-grade (almost completely occluding) stenosis at the junction of the left internal carotid artery cavernous and supraclinoid segment. Decreased signal intensity of left carotid bifurcation and left middle cerebral artery may indicate that flow to this region is inadequate secondary to the proximal stenosis. Moderate plaque right internal carotid artery petrous segment with mild narrowing. Mild to moderate focal narrowing mid aspect M1 segment right middle cerebral artery. Mild narrowing left vertebral artery. Mild irregularity and narrowing of portions of the posterior inferior cerebellar artery bilaterally. Mild irregularity basilar artery without high-grade stenosis. Nonvisualized anterior inferior cerebellar arteries. Moderate narrowing right superior cerebellar artery. Electronically Signed   By: Genia Del M.D.   On: 02/17/2016 16:01   Mr Brain Wo Contrast  02/16/2016  CLINICAL DATA:  Acute encephalopathy.  Atrial fib on Coumadin EXAM: MRI HEAD WITHOUT CONTRAST TECHNIQUE: Multiplanar, multiecho  pulse sequences of the brain and surrounding structures were obtained without intravenous contrast. COMPARISON:  CT 02/15/2016 FINDINGS: Moderate to advanced cerebral atrophy.  Negative for hydrocephalus. Image quality degraded by motion. Multiple areas of acute infarction. This small area of acute infarct in the right lateral cerebellum. Acute infarct left superior cerebellum. Patchy areas of acute infarct in the occipital lobes bilaterally. Acute infarct left thalamus and right internal capsule. No significant chronic ischemic change. Negative for hemorrhage or fluid collection Negative for mass or edema.  No shift of the midline structures Minimal mucosal edema in the paranasal sinuses. Normal orbital contents. Pituitary not enlarged. IMPRESSION: Image quality degraded by motion Multiple areas of acute infarct. These are in the posterior circulation and include the cerebellum bilaterally. Bilateral occipital lobes, and left thalamus. Possible acute infarct also in the right internal capsule. Moderate to advanced atrophy Electronically Signed   By: Franchot Gallo M.D.   On: 02/16/2016 14:24   EEG This is a normal EEG for the patients stated age. There were no focal, hemispheric, or lateralizing features. No epileptiform activity was recorded.    PHYSICAL EXAM Frail elderly male not in distress. . Afebrile. Head is nontraumatic. Neck is supple without bruit.    Cardiac exam no murmur or gallop irregular heart sounds. Lungs are clear to auscultation. Distal pulses are well felt. Neurological Exam ;  Awake  Alert oriented x 1. Diminished attention, registration and recall. Poor insight into his condition. Able to recognize daughter but difficulty  in recollecting names of grandchildren Follows only simple one-step commands. Normal speech and language.eye movements full without nystagmus.fundi were not visualized. Vision acuity and fields appear normal. Hearing is normal. Palatal movements are normal. Face  symmetric. Tongue midline. Normal strength, tone, reflexes and coordination. Normal sensation. Gait deferred. ASSESSMENT/PLAN Mr. Craig Richard is a 82 y.o. male with history of atrial fibrillation on anticoagulation, hypertension, hyperlipidemia and coronary artery disease presenting with new onset confusion. He did not receive IV t-PA due to unclear LKW, no clear focal deficit.   Stroke:  Posterior circulation embolic infarcts secondary to known atrial fibrillation in setting of subtherapeutic INR  Resultant  Remains confused  MRI  B posterior circulation - B cerebellar, occipital and L thalamic infarcts, possible R internal capsule infarct. MRA  High-grade (almost completely occluding) stenosis at the junction of  the left internal carotid artery cavernous and supraclinoid segment. Decreased flow in the left middle cerebral artery and terminal carotid artery  Carotid Doppler  pending   2D Echo  pending   EEG normal  LDL 64  HgbA1c 5.7  Warfarin for VTE prophylaxis Diet Heart Room service appropriate?: Yes; Fluid consistency:: Thin  warfarin daily prior to admission, now on warfarin daily. Changed to eliquis - see below  Patient counseled to be compliant with his antithrombotic medications  Ongoing aggressive stroke risk factor management  Therapy recommendations:  HH PT  Disposition:  Anticipate return home  Atrial Fibrillation  Home anticoagulation:  warfarin daily continued in the hospital  INR 1.88 on admission  Given stroke on subtherapeutic coumadin, recommend change to NOAC.  Daughter is agreeable to change. pharmacy consulted ot assist with transition to eliquis   Hypertension  Stable  Permissive hypertension (OK if < 220/120) but gradually normalize in 5-7 days  Long-term BP goal normotensive  Hyperlipidemia  Home meds:  lipitor 40, resumed in hospital  LDL 64, goal < 70  Continue statin at discharge  Other Stroke Risk Factors  Advanced  age  Family hx stroke (mother)  Coronary artery disease - MI  Other Active Problems  hypokalemia  Hospital day # [place] for Pager information 02/18/2016 1:16 PM  I have personally examined this patient, reviewed notes, independently viewed imaging studies, participated in medical decision making and plan of care. I have made any additions or clarifications directly to the above note. Agree with note above.  He presented with altered mental status and confusion due to bilateral embolic posterior circulation infarcts despite being on anticoagulation with warfarin but suboptimal INR changes are of 1.88. Patient remains at risk for neurological worsening, recurrent stroke, TIA. and bleeding I had a long discussion  with the patient's daughter     regarding his risk for recurrent strokes and risk reduction with anticoagulation and alternative medications to warfarin. After discussing risks and benefits and bleeding risk carefully I recommend changing warfarin to eliquis for long-term anticoagulation and she is in agreement. I suspect patient has mild age-related dementia at baseline and compliance with warfarin going forward also may be an issue. MRA of the brain also shows high-grade left terminal internal carotid artery stenosis but patient is not a candidate for intracranial stenting plus given his age and high risk for bleeding on dual antiplatelet therapy plus his current strokes aren't in the posterior circulation hence the carotid stenosis is asymptomatic Greater than 50% time during this 35 minute visit was spent on counseling and coordination of care about stroke  risk, prevention, treatment Antony Contras, MD Medical Director [facility] Pager: [phone] 02/18/2016 1:16 PM    To contact Stroke Continuity provider, please refer to [email or web address] After hours, contact General Neurology

## 2016-02-18 NOTE — Evaluation (Signed)
Speech Language Pathology Evaluation Patient Details Name: Craig Richard MRN: [id number] DOB: 04-Dec-1921 Today's Date: 02/18/2016 Time: [id number] SLP Time Calculation (min) (ACUTE ONLY): 30 min  Problem List:  Patient Active Problem List   Diagnosis Date Noted  . Acute cerebrovascular accident (CVA) ([place]) 02/17/2016  . Acute encephalopathy   . Altered level of consciousness 02/15/2016  . Delirium 02/15/2016  . CAD in native artery, wth hx ant MI, wth PTCA to LAD, 2010 06/09/2013  . Atrial fibrillation ([place])   . Hyperlipidemia   . Hypertension    Past Medical History:  Past Medical History  Diagnosis Date  . Atrial fibrillation ([place])     coumadin - follow by Reinaldo Meeker at [facility]  . Hyperlipidemia   . Hypertension   . History of anterior wall myocardial infarction 09/17/2008  . History of nuclear stress test 06/14/2010    bruce protocol myoview; stress images show perfusion defect in apical wall (scar)  . CAD (coronary artery disease)    Past Surgical History:  Past Surgical History  Procedure Laterality Date  . Eye surgery Bilateral     cataract  . Esophagogastroduodenoscopy N/A 10/29/2012    Procedure: ESOPHAGOGASTRODUODENOSCOPY (EGD);  Surgeon: Arta Silence, MD;  Location: [facility];  Service: Endoscopy;  Laterality: N/A;  . Hot hemostasis N/A 10/29/2012    Procedure: HOT HEMOSTASIS (ARGON PLASMA COAGULATION/BICAP);  Surgeon: Arta Silence, MD;  Location: [facility];  Service: Endoscopy;  Laterality: N/A;  . Coronary angioplasty  09/17/2008    r/t acute anterior wall MI; apical LAD lesion  . Transthoracic echocardiogram  06/29/2010    EF=>55%, normal LV systolic function; LA & RA mod dilated; mild mitral annular calcif & mild MR; mod TR & elevated RV systolic pressure (mild pulm HTN); AV mildly sclerotic; mild pulm valve regurg  . Prostate surgery  2000  . Hemorrhoid surgery  2005   HPI:  82 y.o. male with medical history significant for Afib, Mi. HTN,CAD  who is being admitted due to delirium, found pt asleep on the floor and walked to bathroom with dtr, fell hitting back (not head). Per chart and pt's daughter's report reveal he is very independent, lives alone, cooks, performs own ADLs and still drives. MRI showed multiple areas of acute infarct in the posterior circulation and include the cerebellum bilaterally. Bilateral occipital lobes, and left thalamus. Possible acute infarct also in   Assessment / Plan / Recommendation Clinical Impression  Prior to admission, pt living alone and independent with all ADL's. Results of standardized assessment (Cognistat) reveal deficits in spatial and situational orientation (flucuating), working memory, moderately complex verbal problem solving. ST will treat while in hospital and recommend follow up ST (home health). He is not safe to stay alone and needs 24 hour supervision. SLP assisted pt to bathroom with unsteadiness standing at toilet requirng min assist. Discussed with daughter who reports she and her sister are teachers and planning on being with him 24/7 the next 3 weeks. Educated her re: pending PT/OT will assess and make recommendations for follow up. Pt will likely need continued assist/24 hour supervision after 3 week period.      SLP Assessment  Patient needs continued Speech Lanaguage Pathology Services    Follow Up Recommendations  Home health SLP    Frequency and Duration min 2x/week  2 weeks      SLP Evaluation Prior Functioning  Cognitive/Linguistic Baseline: Within functional limits Type of Home: House  Lives With: Alone Available Help at Discharge: Family;Available PRN/intermittently  Cognition  Overall Cognitive Status: Impaired/Different from baseline Arousal/Alertness: Awake/alert Orientation Level: Oriented to person;Disoriented to place;Disoriented to situation;Oriented to time Attention: Sustained Sustained Attention: Appears intact Memory: Impaired Memory Impairment:  Retrieval deficit;Storage deficit;Decreased recall of new information;Decreased short term memory Decreased Short Term Memory: Verbal basic Awareness: Impaired Awareness Impairment: Anticipatory impairment Problem Solving: Impaired Problem Solving Impairment: Verbal basic Safety/Judgment: Impaired    Comprehension  Auditory Comprehension Overall Auditory Comprehension: Appears within functional limits for tasks assessed Yes/No Questions: Within Functional Limits Commands: Within Functional Limits Visual Recognition/Discrimination Discrimination: Not tested Reading Comprehension Reading Status: Within funtional limits    Expression Expression Primary Mode of Expression: Verbal Verbal Expression Overall Verbal Expression: Appears within functional limits for tasks assessed Initiation: No impairment Level of Generative/Spontaneous Verbalization: Conversation Repetition: No impairment Naming: No impairment Pragmatics: No impairment Written Expression Dominant Hand: Right Written Expression: Not tested   Oral / Motor  Oral Motor/Sensory Function Overall Oral Motor/Sensory Function: Within functional limits Motor Speech Overall Motor Speech: Appears within functional limits for tasks assessed Respiration: Within functional limits Phonation: Normal Resonance: Within functional limits Articulation: Within functional limitis Intelligibility: Intelligible Motor Planning: Witnin functional limits   GO                    Houston Siren 02/18/2016, 1:39 PM  Orbie Pyo Arcenia Scarbro M.Ed [employer] [phone]

## 2016-02-18 NOTE — Progress Notes (Signed)
PROGRESS NOTE   Craig Richard [id number] DOB: 09/22/1921 DOA: 02/15/2016 PCP: [name], MD Outpatient Specialists:    Brief Narrative:  82 year old independent male living alone history of A. fib on Coumadin, hypertension, hyperlipidemia and prior history of coronary artery disease admitted with acute delirium. Patient was completely normal 1 day prior to admission. On the morning of admission daughter found him asleep on the floor and had urinated in a dresser drawer. He was very confused and unable to walk properly to the bathroom. He tried to urinate but missed the commode and fell hitting his back onto the tub. Did not hit his head or lost consciousness. EMS was called and patient brought to the ED. In the ED his head CT was unremarkable, UA and chest x-ray negative for infection. Labs were unremarkable except for mild hypokalemia. Admitted for further workup.   Assessment & Plan:  Principal Problem:  Acute cerebrovascular accident (CVA) ([place]) Active Problems:  CAD in native artery, wth hx ant MI, wth PTCA to LAD, 2010  Atrial fibrillation ([facility])  Altered level of consciousness  Delirium   Acute CVA, diffuse lesions throughout the posterior circulation -Needs risk factor control - afib, HTN, HLD -HgbA1c pending -fasting lipid panel - LDL at goal, HDL is low, could add fish oil 2. MRA of the brain without contrast most concerning findings:  -Abnormal appearance of the left internal carotid artery petrous segment and supraclinoid segment with significant narrowing which may reflect result of dissection and/ or atherosclerotic disease.  -High-grade (almost completely occluding) stenosis at the junction of the left internal carotid artery cavernous and supraclinoid segment. -Discussed with Dr. Leonie Man - he thinks this is chronic and incidental and recommends changing from Coumadin to Eliquis due to stroke on subtherapeutic Coumadin.  -Not a  candidate for further intervention.  -PT consult - recommends home health PT, a rolling walker, and 24 hour supervision -OT consult pending -speech therapy - recommends home health SLP -Echocardiogram pending -Carotid dopplers performed, result pending -Prophylactic therapy-Anticoagulation: Coumadin -Risk factor modification -Telemetry monitoring -TSH, HIV antibody and RPR normal.  HLD LDL 64, goal <60 Continue Lipitor 40mg   B 12 deficiency Replete and maintain on replacement therapy  A. fib -Rate controlled.  -Previously on Coumadin. -Patient's daughter clearly states that patient has specifically stated in the past that he would rather bleed to death on anticoagulant than to have a serious stroke that leaves him debilitated. They would like to continue anticoagulation.  -Will change to Eliquis for ease in dosing, 2.5 mg daily as per Dr. Leonie Man. Pharmacy to dose.  Hypokalemia Replenish  Essential hypertension Continue home medications  CAD Continue beta blocker and statin  DVT prophylaxis:  Coumadin, changing to Eliquis Code Status: DNR Family Communication: Daughter at bedside Disposition Plan:  Likely home with home health tomorrow, but possibly inpatient rehab early next week   Consultants:   Neurology  Procedures:  EEG  Echo  Carotid dopplers  Antimicrobials:  None   Subjective: Reports feeling ok. Acknowledges some confusion.  Nursing reports a 2.2 second pause on telemetry.  Objective: Filed Vitals:   02/18/16 0227 02/18/16 0446 02/18/16 0900 02/18/16 1336  BP: 113/79 126/72 109/69 115/77  Pulse: 74 81 63 64  Temp: 98.4 F (36.9 C) 98.4 F (36.9 C) 98.3 F (36.8 C) 95.1 F (35.1 C)  TempSrc: Oral Oral Oral Oral  Resp: 16 16 18 18   Height:      Weight:      SpO2: 97% 98% 98% 100%  Intake/Output Summary (Last 24 hours) at 02/18/16 1434 Last data filed at 02/18/16 0831  Gross per 24 hour  Intake    240 ml  Output      0 ml    Net    240 ml   Filed Weights   02/16/16 2351  Weight: 68.8 kg (151 lb 10.8 oz)    Examination:  General exam: Appears calm and comfortable, alert and oriented to person and time today Respiratory system: Clear to auscultation. Respiratory effort normal. Cardiovascular system: S1 & S2 heard, RRR. No JVD, murmurs, rubs, gallops or clicks. No pedal edema. Gastrointestinal system: Abdomen is nondistended, soft and nontender. No organomegaly or masses felt. Normal bowel sounds heard. Central nervous system: Alert and oriented. No focal neurological deficits.  Acknowledges mild confusion, overall better cognition today Extremities: Symmetric 5 x 5 power. Skin: No rashes, lesions or ulcers Psychiatry: Judgement and insight appear improved. Mood & affect appropriate.     Data Reviewed: I have personally reviewed following labs and imaging studies  CBC:  Recent Labs Lab 02/15/16 1108 02/16/16 0344 02/16/16 1446 02/17/16 1327 02/18/16 0426  WBC 5.9 4.1  --  4.9 4.7  NEUTROABS 4.6  --   --   --   --   HGB 11.3* 11.0*  --  11.2* 10.5*  HCT 34.3* 33.9* 31.5* 36.3* 33.4*  MCV 92.5 93.9  --  95.8 96.3  PLT 202 155  --  180 [id number]*   Basic Metabolic Panel:  Recent Labs Lab 02/15/16 1152 02/16/16 0344 02/17/16 1327 02/18/16 0426  NA 136 138 139 140  K 3.2* 4.3 3.8 3.7  CL 103 111 106 108  CO2 23 19* 26 24  GLUCOSE 94 78 158* 101*  BUN 19 17 21* 20  CREATININE 1.34* 1.11 1.37* 1.18  CALCIUM 8.6* 8.2* 9.0 8.5*   GFR: Estimated Creatinine Clearance: 38.9 mL/min (by C-G formula based on Cr of 1.18). Liver Function Tests:  Recent Labs Lab 02/15/16 1152  AST 15  ALT 8*  ALKPHOS 73  BILITOT 1.2  PROT 6.5  ALBUMIN 4.0   No results for input(s): LIPASE, AMYLASE in the last 168 hours. No results for input(s): AMMONIA in the last 168 hours. Coagulation Profile:  Recent Labs Lab 02/15/16 1152 02/16/16 0344 02/17/16 1327 02/18/16 0426  INR 1.88* 2.20* 1.76* 1.96*    Cardiac Enzymes: No results for input(s): CKTOTAL, CKMB, CKMBINDEX, TROPONINI in the last 168 hours. BNP (last 3 results) No results for input(s): PROBNP in the last 8760 hours. HbA1C:  Recent Labs  02/17/16 0627  HGBA1C 5.7*   CBG: No results for input(s): GLUCAP in the last 168 hours. Lipid Profile:  Recent Labs  02/17/16 0627  CHOL 106  HDL 32*  LDLCALC 64  TRIG 50  CHOLHDL 3.3   Thyroid Function Tests: No results for input(s): TSH, T4TOTAL, FREET4, T3FREE, THYROIDAB in the last 72 hours. Anemia Panel:  Recent Labs  02/16/16 0811  VITAMINB12 143*   Urine analysis:    Component Value Date/Time   COLORURINE YELLOW 02/15/2016 [place] 02/15/2016 1354   LABSPEC 1.015 02/15/2016 1354   PHURINE 7.0 02/15/2016 1354   GLUCOSEU NEGATIVE 02/15/2016 1354   HGBUR SMALL* 02/15/2016 1354   BILIRUBINUR NEGATIVE 02/15/2016 1354   KETONESUR 15* 02/15/2016 1354   PROTEINUR 30* 02/15/2016 1354   UROBILINOGEN 0.2 09/23/2007 0821   NITRITE NEGATIVE 02/15/2016 1354   LEUKOCYTESUR NEGATIVE 02/15/2016 1354   Sepsis Labs: @LABRCNTIP (procalcitonin:4,lacticidven:4)  )No results found  for this or any previous visit (from the past 240 hour(s)).       Radiology Studies: Mr Virgel Paling [id number] Contrast  02/17/2016  CLINICAL DATA:  82 year old hypertensive male with altered mental status. Atrial fibrillation on Coumadin. Recent bilateral acute infarcts noted. Subsequent encounter. EXAM: MRA HEAD WITHOUT CONTRAST TECHNIQUE: Angiographic images of the Circle of Willis were obtained using MRA technique without intravenous contrast. COMPARISON:  02/16/2016 brain MR. 02/15/2016 CT. FINDINGS: Abnormal appearance of the left internal carotid artery petrous segment and supraclinoid segment with significant narrowing which may reflect result of dissection and/ or atherosclerotic disease. Flow was seen within the left internal carotid artery cavernous segment however there is a high  grade (almost completely occluding) stenosis at the junction of the left internal carotid artery cavernous and supraclinoid segment. Decreased signal intensity of left carotid bifurcation and left middle cerebral artery may indicate that flow to this region is inadequate secondary to the proximal stenosis. Moderate plaque right internal carotid artery petrous segment with mild narrowing. Mild to moderate focal narrowing mid aspect M1 segment right middle cerebral artery. Mild narrowing left vertebral artery. Mild irregularity and narrowing of portions of the posterior inferior cerebellar artery bilaterally. Mild irregularity basilar artery without high-grade stenosis. Nonvisualized anterior inferior cerebellar arteries. Moderate narrowing right superior cerebellar artery. Minimal deposition, laminar necrosis or blood breakdown products right globus pallidus unchanged. IMPRESSION: Abnormal appearance of the left internal carotid artery petrous segment and supraclinoid segment with significant narrowing which may reflect result of dissection and/ or atherosclerotic disease. High-grade (almost completely occluding) stenosis at the junction of the left internal carotid artery cavernous and supraclinoid segment. Decreased signal intensity of left carotid bifurcation and left middle cerebral artery may indicate that flow to this region is inadequate secondary to the proximal stenosis. Moderate plaque right internal carotid artery petrous segment with mild narrowing. Mild to moderate focal narrowing mid aspect M1 segment right middle cerebral artery. Mild narrowing left vertebral artery. Mild irregularity and narrowing of portions of the posterior inferior cerebellar artery bilaterally. Mild irregularity basilar artery without high-grade stenosis. Nonvisualized anterior inferior cerebellar arteries. Moderate narrowing right superior cerebellar artery. Electronically Signed   By: Genia Del M.D.   On: 02/17/2016 16:01         Scheduled Meds: . apixaban  2.5 mg Oral BID  . atorvastatin  80 mg Oral QODAY  . feeding supplement (ENSURE ENLIVE)  237 mL Oral BID BM  . isosorbide mononitrate  60 mg Oral QAC breakfast  . metoprolol  75 mg Oral BID  . multivitamin with minerals  1 tablet Oral Daily  . vitamin B-12  1,000 mcg Oral Daily   Continuous Infusions: . sodium chloride 75 mL/hr at 02/17/16 0110     LOS: 1 day    Time spent: 35 minutes    Karmen Bongo, MD [facility]  If 7PM-7AM, please contact night-coverage [email or web address] Password [facility] 02/18/2016, 2:34 PM

## 2016-02-19 ENCOUNTER — Inpatient Hospital Stay (HOSPITAL_COMMUNITY): Payer: Medicare Other

## 2016-02-19 DIAGNOSIS — I75011 Atheroembolism of right upper extremity: Secondary | ICD-10-CM

## 2016-02-19 DIAGNOSIS — I708 Atherosclerosis of other arteries: Secondary | ICD-10-CM | POA: Clinically undetermined

## 2016-02-19 DIAGNOSIS — I6789 Other cerebrovascular disease: Secondary | ICD-10-CM

## 2016-02-19 DIAGNOSIS — I639 Cerebral infarction, unspecified: Secondary | ICD-10-CM

## 2016-02-19 LAB — CBC
HCT: 33.4 % — ABNORMAL LOW (ref 39.0–52.0)
HEMOGLOBIN: 10.6 g/dL — AB (ref 13.0–17.0)
MCH: 29.9 pg (ref 26.0–34.0)
MCHC: 31.7 g/dL (ref 30.0–36.0)
MCV: 94.4 fL (ref 78.0–100.0)
Platelets: 162 10*3/uL (ref 150–400)
RBC: 3.54 MIL/uL — ABNORMAL LOW (ref 4.22–5.81)
RDW: 15 % (ref 11.5–15.5)
WBC: 4.5 10*3/uL (ref 4.0–10.5)

## 2016-02-19 LAB — BASIC METABOLIC PANEL
Anion gap: 5 (ref 5–15)
BUN: 18 mg/dL (ref 6–20)
CALCIUM: 8.6 mg/dL — AB (ref 8.9–10.3)
CHLORIDE: 106 mmol/L (ref 101–111)
CO2: 27 mmol/L (ref 22–32)
CREATININE: 1.27 mg/dL — AB (ref 0.61–1.24)
GFR calc Af Amer: 55 mL/min — ABNORMAL LOW (ref >60)
GFR calc non Af Amer: 47 mL/min — ABNORMAL LOW (ref >60)
GLUCOSE: 103 mg/dL — AB (ref 65–99)
Potassium: 3.9 mmol/L (ref 3.5–5.1)
Sodium: 138 mmol/L (ref 135–145)

## 2016-02-19 LAB — ECHOCARDIOGRAM COMPLETE
CHL CUP TV REG PEAK VELOCITY: 299 cm/s
E decel time: 148 msec
EERAT: 11.72
FS: 21 % — AB (ref 28–44)
HEIGHTINCHES: 72 in
IV/PV OW: 1.06
LA ID, A-P, ES: 50 mm
LA diam end sys: 50 mm
LA diam index: 2.69 cm/m2
LA vol A4C: 74.4 ml
LV E/e'average: 11.72
LV e' LATERAL: 8.7 cm/s
LVEEMED: 11.72
LVOT area: 2.84 cm2
LVOT diameter: 19 mm
MV Dec: 148
MV pk E vel: 102 m/s
MVPG: 4 mmHg
PW: 10.3 mm — AB (ref 0.6–1.1)
TAPSE: 15.5 mm
TDI e' lateral: 8.7
TDI e' medial: 7.07
TRMAXVEL: 299 cm/s
WEIGHTICAEL: 2426.82 [oz_av]

## 2016-02-19 MED ORDER — APIXABAN 2.5 MG PO TABS: 2.5000 mg | ORAL_TABLET | Freq: Two times a day (BID) | ORAL | Status: AC

## 2016-02-19 MED ORDER — CYANOCOBALAMIN 1000 MCG PO TABS: 1000.0000 ug | ORAL_TABLET | Freq: Every day | ORAL | Status: AC

## 2016-02-19 NOTE — Progress Notes (Addendum)
STROKE TEAM PROGRESS NOTE   HISTORY OF PRESENT ILLNESS (per record) Craig Richard is an 82 y.o. male with a history of atrial fibrillation on anticoagulation, hypertension, hyperlipidemia and coronary artery disease, admitted to [facility] on 02/15/2016 for acute altered mental status with confusion. Patient reportedly is usually of sound mind and independent and able to care for himself. CT scan on 02/15/2016 was unremarkable for acute changes. MRI on 02/16/2016 showed multiple areas of acute infarction involving cerebellum as well as right and left occipital regions. Patient has not experienced visual changes nor vertigo or nausea. He is on Coumadin and his INR on admission was 1.88. MRI on 02/16/2016 was 2.20. He had an EEG on 02/16/2016 which was normal. NIH stroke score at the time of this evaluation was 1. He was LKW 02/14/2016. Patient was not administered IV t-PA secondary to unclear exactly when he was last known well; no clear focal deficit. He was admitted for further evaluation and treatment.   SUBJECTIVE (INTERVAL HISTORY) Daughter Amy  is not at the bedside today. He is sitting up in the chair. Patient has no complaints. Carotid dopplers show asymptomatic right subclavian disease OBJECTIVE Temp:  [95.1 F (35.1 C)-98.8 F (37.1 C)] 98.7 F (37.1 C) (07/02 0924) Pulse Rate:  [64-72] 72 (07/02 0924) Cardiac Rhythm:  [-] Atrial fibrillation (07/02 0724) Resp:  [16-19] 19 (07/02 0924) BP: (104-152)/(65-97) 106/80 mmHg (07/02 0924) SpO2:  [98 %-100 %] 98 % (07/02 0924)  CBC:  Recent Labs Lab 02/15/16 1108  02/18/16 0426 02/19/16 0549  WBC 5.9  < > 4.7 4.5  NEUTROABS 4.6  --   --   --   HGB 11.3*  < > 10.5* 10.6*  HCT 34.3*  < > 33.4* 33.4*  MCV 92.5  < > 96.3 94.4  PLT 202  < > 146* 162  < > = values in this interval not displayed.  Basic Metabolic Panel:   Recent Labs Lab 02/18/16 0426 02/19/16 0549  NA 140 138  K 3.7 3.9  CL 108 106  CO2 24 27   GLUCOSE 101* 103*  BUN 20 18  CREATININE 1.18 1.27*  CALCIUM 8.5* 8.6*    Lipid Panel:     Component Value Date/Time   CHOL 106 02/17/2016 0627   TRIG 50 02/17/2016 0627   HDL 32* 02/17/2016 0627   CHOLHDL 3.3 02/17/2016 0627   VLDL 10 02/17/2016 0627   LDLCALC 64 02/17/2016 0627   HgbA1c:  Lab Results  Component Value Date   HGBA1C 5.7* 02/17/2016   Urine Drug Screen: No results found for: LABOPIA, COCAINSCRNUR, LABBENZ, AMPHETMU, Ainsley Spinner    IMAGING  Mr Jodene Nam Head Wo Contrast  02/17/2016  CLINICAL DATA:  82 year old hypertensive male with altered mental status. Atrial fibrillation on Coumadin. Recent bilateral acute infarcts noted. Subsequent encounter. EXAM: MRA HEAD WITHOUT CONTRAST TECHNIQUE: Angiographic images of the Circle of Willis were obtained using MRA technique without intravenous contrast. COMPARISON:  02/16/2016 brain MR. 02/15/2016 CT. FINDINGS: Abnormal appearance of the left internal carotid artery petrous segment and supraclinoid segment with significant narrowing which may reflect result of dissection and/ or atherosclerotic disease. Flow was seen within the left internal carotid artery cavernous segment however there is a high grade (almost completely occluding) stenosis at the junction of the left internal carotid artery cavernous and supraclinoid segment. Decreased signal intensity of left carotid bifurcation and left middle cerebral artery may indicate that flow to this region is inadequate secondary to the proximal  stenosis. Moderate plaque right internal carotid artery petrous segment with mild narrowing. Mild to moderate focal narrowing mid aspect M1 segment right middle cerebral artery. Mild narrowing left vertebral artery. Mild irregularity and narrowing of portions of the posterior inferior cerebellar artery bilaterally. Mild irregularity basilar artery without high-grade stenosis. Nonvisualized anterior inferior cerebellar arteries. Moderate narrowing  right superior cerebellar artery. Minimal deposition, laminar necrosis or blood breakdown products right globus pallidus unchanged. IMPRESSION: Abnormal appearance of the left internal carotid artery petrous segment and supraclinoid segment with significant narrowing which may reflect result of dissection and/ or atherosclerotic disease. High-grade (almost completely occluding) stenosis at the junction of the left internal carotid artery cavernous and supraclinoid segment. Decreased signal intensity of left carotid bifurcation and left middle cerebral artery may indicate that flow to this region is inadequate secondary to the proximal stenosis. Moderate plaque right internal carotid artery petrous segment with mild narrowing. Mild to moderate focal narrowing mid aspect M1 segment right middle cerebral artery. Mild narrowing left vertebral artery. Mild irregularity and narrowing of portions of the posterior inferior cerebellar artery bilaterally. Mild irregularity basilar artery without high-grade stenosis. Nonvisualized anterior inferior cerebellar arteries. Moderate narrowing right superior cerebellar artery. Electronically Signed   By: Genia Del M.D.   On: 02/17/2016 16:01   EEG This is a normal EEG for the patients stated age. There were no focal, hemispheric, or lateralizing features. No epileptiform activity was recorded.    PHYSICAL EXAM Frail elderly male not in distress. . Afebrile. Head is nontraumatic. Neck is supple without bruit.    Cardiac exam no murmur or gallop irregular heart sounds. Lungs are clear to auscultation. Distal pulses are well felt. Neurological Exam ;  Awake  Alert oriented x 1. Diminished attention, registration and recall. Poor insight into his condition. Able to recognize daughter but difficulty in recollecting names of grandchildren Follows only simple one-step commands. Normal speech and language.eye movements full without nystagmus.fundi were not visualized.  Vision acuity and fields appear normal. Hearing is normal. Palatal movements are normal. Face symmetric. Tongue midline. Normal strength, tone, reflexes and coordination. Normal sensation. Gait deferred. ASSESSMENT/PLAN Mr. ELIBERTO STEURER is a 82 y.o. male with history of atrial fibrillation on anticoagulation, hypertension, hyperlipidemia and coronary artery disease presenting with new onset confusion. He did not receive IV t-PA due to unclear LKW, no clear focal deficit.   Stroke:  Posterior circulation embolic infarcts secondary to known atrial fibrillation in setting of subtherapeutic INR  Resultant  Remains confused  MRI  B posterior circulation - B cerebellar, occipital and L thalamic infarcts, possible R internal capsule infarct. MRA  High-grade (almost completely occluding) stenosis at the junction of  the left internal carotid artery cavernous and supraclinoid segment. Decreased flow in the left middle cerebral artery and terminal carotid artery  Carotid Doppler  Bilateral: 1-39% ICA stenosis. Vertebral artery flow is antegrade. The right subclavian artery waveform is diminished and spiked 2D Echo Left ventricle: The cavity size was normal. Wall thickness was  normal. Systolic function was normal. The estimated ejection  fraction was in the range of 55% to 60%. Wall motion was normal;   there were no regional wall motion abnormalities  EEG normal  LDL 64  HgbA1c 5.7  Warfarin for VTE prophylaxis Diet Heart Room service appropriate?: Yes; Fluid consistency:: Thin  warfarin daily prior to admission, now on warfarin daily. Changed to eliquis - see below  Patient counseled to be compliant with his antithrombotic medications  Ongoing aggressive stroke risk  factor management  Therapy recommendations:  HH PT  Disposition:  Anticipate return home  Atrial Fibrillation  Home anticoagulation:  warfarin daily continued in the hospital  INR 1.88 on admission  Given  stroke on subtherapeutic coumadin, recommend change to NOAC.  Daughter is agreeable to change. pharmacy consulted ot assist with transition to eliquis   Hypertension  Stable  Permissive hypertension (OK if < 220/120) but gradually normalize in 5-7 days  Long-term BP goal normotensive  Hyperlipidemia  Home meds:  lipitor 40, resumed in hospital  LDL 64, goal < 70  Continue statin at discharge  Other Stroke Risk Factors  Advanced age  Family hx stroke (mother)  Coronary artery disease - MI  Other Active Problems  hypokalemia  Hospital day # [place] for Pager information 02/19/2016 12:43 PM  I have personally examined this patient, reviewed notes, independently viewed imaging studies, participated in medical decision making and plan of care. I have made any additions or clarifications directly to the above note. Agree with note above.  He presented with altered mental status and confusion due to bilateral embolic posterior circulation infarcts despite being on anticoagulation with warfarin but suboptimal INR changes are of 1.88. Patient remains at risk for neurological worsening, recurrent stroke, TIA. and bleeding I had a long discussion  with the patient's daughter     regarding his risk for recurrent strokes and risk reduction with anticoagulation and alternative medications to warfarin. After discussing risks and benefits and bleeding risk carefully.Continue eliquis for long-term anticoagulation    . I suspect patient has mild age-related dementia at baseline and compliance with medications going forward also may be an issue but daughter stated family will take responsibility for his medicines.Marland Kitchen MRA of the brain also shows high-grade left terminal internal carotid artery stenosis but patient is not a candidate for intracranial stenting plus given his age and high risk for bleeding on dual antiplatelet therapy plus his current strokes aren't  in the posterior circulation hence the carotid stenosis is asymptomatic he also has abnormal right subclavian artery waveform but this is asymptomatic and hence can be managed with medical therapy as well Greater than 50% time during this 25 minute visit was spent on counseling and coordination of care about stroke risk, prevention, treatment.Stroke team will sign off. Call for questions. Antony Contras, MD Medical Director [facility] Pager: [phone] 02/19/2016 12:43 PM    To contact Stroke Continuity provider, please refer to [email or web address] After hours, contact General Neurology

## 2016-02-19 NOTE — Progress Notes (Signed)
Patient's family wanting to pick up equipment tomorrow at store. Patient helped to car

## 2016-02-19 NOTE — Consult Note (Addendum)
[facility] [place]  Patient name: Craig Richard MRN: [id number] DOB: 04-26-1922 Sex: male  REASON FOR CONSULT: Abnormal right subclavian artery waveform on duplex. The consult is from Dr. Lorin Mercy.  HPI: Craig Richard is a 82 y.o. male, who was admitted on 02/15/2016 with confusion. He has a history of atrial fibrillation and has been on Coumadin.He lives alone and was found by his daughter asleep on the floor. He was confused. His workup included an MRI which showed multiples areas of acute infarct including the cerebellum bilateral There was also possibly an acute infarct in the right internal capsule.  The patient is a poor historian. He denies any focal weakness or paresthesias, expressive or receptive aphasia, or amaurosis fugax. He is right handed.   His workup included a carotid  Duplex scan which showed no evidence of extracranial carotid disease bilaterally. However, there was disease in the right subclavian artery noted, and therefor, Vascular surgery was consulted.   Past Medical History  Diagnosis Date  . Atrial fibrillation ([place])     coumadin - follow by Reinaldo Meeker at [facility]  . Hyperlipidemia   . Hypertension   . History of anterior wall myocardial infarction 09/17/2008  . History of nuclear stress test 06/14/2010    bruce protocol myoview; stress images show perfusion defect in apical wall (scar)  . CAD (coronary artery disease)     Family History  Problem Relation Age of Onset  . Stroke Mother   . Aneurysm Father     abdominal  . Cancer Maternal Grandmother   . Emphysema Sister   . Cancer Brother   . Heart disease Brother   . Cancer Sister     SOCIAL HISTORY: Social History   Social History  . Marital Status: Widowed    Spouse Name: N/A  . Number of Children: 2  . Years of Education: B.S. [employer]   Occupational History  .     Social History Main Topics  . Smoking status: Never Smoker   . Smokeless tobacco: Current User      Types: Chew     Comment: occasionally  . Alcohol Use: No  . Drug Use: No  . Sexual Activity: Not on file   Other Topics Concern  . Not on file   Social History Narrative    No Known Allergies  Current Facility-Administered Medications  Medication Dose Route Frequency Provider Last Rate Last Dose  . 0.9 %  sodium chloride infusion   Intravenous Continuous Nishant Dhungel, MD 75 mL/hr at 02/17/16 0110    . acetaminophen (TYLENOL) tablet 650 mg  650 mg Oral Q6H PRN Mir Marry Guan, MD       Or  . acetaminophen (TYLENOL) suppository 650 mg  650 mg Rectal Q6H PRN Mir Marry Guan, MD      . apixaban (ELIQUIS) tablet 2.5 mg  2.5 mg Oral BID Garvin Fila, MD   2.5 mg at 02/19/16 0937  . atorvastatin (LIPITOR) tablet 80 mg  80 mg Oral QODAY Nishant Dhungel, MD   80 mg at 02/18/16 1709  . feeding supplement (ENSURE ENLIVE) (ENSURE ENLIVE) liquid 237 mL  237 mL Oral BID BM Mir Marry Guan, MD   237 mL at 02/19/16 [phone]  . isosorbide mononitrate (IMDUR) 24 hr tablet 60 mg  60 mg Oral QAC breakfast Dominyck Schwartz, MD   60 mg at 02/19/16 0937  . metoprolol tartrate (LOPRESSOR) tablet 75 mg  75 mg Oral BID Herbie Baltimore  Audie Pinto, MD   75 mg at 02/19/16 [id number]  . multivitamin with minerals tablet 1 tablet  1 tablet Oral Daily Mir Marry Guan, MD   1 tablet at 02/19/16 [phone]  . nitroGLYCERIN (NITROSTAT) SL tablet 0.4 mg  0.4 mg Sublingual Q5 min PRN Curlie Schwartz, MD      . senna-docusate (Senokot-S) tablet 1 tablet  1 tablet Oral QHS PRN Nishant Dhungel, MD      . vitamin B-12 (CYANOCOBALAMIN) tablet 1,000 mcg  1,000 mcg Oral Daily Nishant Dhungel, MD   1,000 mcg at 02/19/16 [id number]    REVIEW OF SYSTEMS:  [X]  denotes positive finding, [ ]  denotes negative finding Cardiac  Comments:  Chest pain or chest pressure:    Shortness of breath upon exertion:    Short of breath when lying flat:    Irregular heart rhythm: X H/o afib      Vascular    Pain in calf, thigh, or hip  brought on by ambulation:    Pain in feet at night that wakes you up from your sleep:     Blood clot in your veins:    Leg swelling:         Pulmonary    Oxygen at home:    Productive cough:     Wheezing:         Neurologic    Sudden weakness in arms or legs:     Sudden numbness in arms or legs:     Sudden onset of difficulty speaking or slurred speech:    Temporary loss of vision in one eye:     Problems with dizziness:         Gastrointestinal    Blood in stool:     Vomited blood:         Genitourinary    Burning when urinating:     Blood in urine:        Psychiatric    Major depression:         Hematologic    Bleeding problems:    Problems with blood clotting too easily:        Skin    Rashes or ulcers:        Constitutional    Fever or chills:      PHYSICAL EXAM: Filed Vitals:   02/18/16 2151 02/19/16 0124 02/19/16 0544 02/19/16 0924  BP: 115/69 106/81 152/97 106/80  Pulse: 67 70 64 72  Temp: 98.5 F (36.9 C) 98.8 F (37.1 C) 98.3 F (36.8 C) 98.7 F (37.1 C)  TempSrc: Oral Oral Oral Oral  Resp: 17 16 17 19   Height:      Weight:      SpO2: 99% 98% 99% 98%    GENERAL: The patient is a well-nourished male, in no acute distress. The vital signs are documented above. CARDIAC: There is a regular rate and rhythm.  VASCULAR: I do not detect carotid bruits. He has a palpable left radial pulse.I cannot palpate a right radial pulse. He has palpable femoral, popliteal, and posterior tibial pulses bilat.  PULMONARY: There is good air exchange bilaterally without wheezing or rales. ABDOMEN: Soft and non-tender with normal pitched bowel sounds.  MUSCULOSKELETAL: There are no major deformities or cyanosis. NEUROLOGIC: No focal weakness or paresthesias are detected. SKIN: There are no ulcers or rashes noted. PSYCHIATRIC: The patient has a normal affect.  DATA:  MRI results above.   Carotid Duplex: < 39% ICA stenosis bilaterally.  Abnormal flow in right  Subclavian  artery. Not a mobile clot. More likely chronic.  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 He is asymptomatic. He is anticoagulated currently on Eliquis. He was on Coumadin in the past. He does have intracranial disease and Dr. Leonie Man has made recommendations on this. No further vascular workup indicated. I told him to always have his BP taken in the left arm.   Deitra Mayo [facility] [place] [phone]

## 2016-02-19 NOTE — Discharge Summary (Addendum)
Physician Discharge Summary  Craig Richard [id number] DOB: 1921-08-31 DOA: 02/15/2016  PCP: Craig Seashore, MD  Admit date: 02/15/2016 Discharge date: 02/19/2016  Time spent: 45 minutes  Recommendations for Outpatient Follow-up:  Patient will be discharged to home with home health PT/OT.  Patient will need to follow up with primary care provider within one week of discharge.  Patient should continue medications as prescribed.  Patient should follow a heart healthy diet.    Discharge Diagnoses:  Principal Problem:   Acute cerebrovascular accident (CVA) ([place]) Active Problems:   CAD in native artery, wth hx ant MI, wth PTCA to LAD, 2010   Atrial fibrillation (HCC)   Altered level of consciousness   Delirium   Right subclavian artery occlusion [facility])   Discharge Condition: Improved  Diet recommendation: Heart healthy  Filed Weights   02/16/16 2351  Weight: 68.8 kg (151 lb 10.8 oz)    History of present illness:  82 year old independent male living alone history of A. fib on Coumadin, hypertension, hyperlipidemia and prior history of coronary artery disease admitted with acute delirium. Patient was completely normal 1 day prior to admission. On the morning of admission daughter found him asleep on the floor and had urinated in a dresser drawer. He was very confused and unable to walk properly to the bathroom. He tried to urinate but missed the commode and fell hitting his back onto the tub. Did not hit his head or lost consciousness. EMS was called and patient brought to the ED. In the ED his head CT was unremarkable, UA and chest x-ray negative for infection. Labs were unremarkable except for mild hypokalemia. Admitted for further workup.  Hospital Course:  Acute CVA, diffuse lesions throughout the posterior circulation -Needs risk factor control - afib, HTN, HLD -HgbA1c minimally elevated at 5.7 - for his age, this is quite reasonable -fasting lipid panel - LDL at goal,  HDL is low, could add fish oil 2. MRA of the brain without contrast most concerning findings: -Abnormal appearance of the left internal carotid artery petrous segment and supraclinoid segment with significant narrowing which may reflect result of dissection and/ or atherosclerotic disease. -High-grade (almost completely occluding) stenosis at the junction of the left internal carotid artery cavernous and supraclinoid segment. -Discussed with Dr. Leonie Man - he thinks this is chronic and incidental and recommends changing from Coumadin to Eliquis due to stroke on subtherapeutic Coumadin.  -Not a candidate for further intervention.  -PT consult - recommends home health PT, a rolling walker, and 24 hour supervision -OT consult - recommends home health OT, 3 in 1 bedside commode, tub seat -speech therapy - recommends home health SLP -Echocardiogram - Normal LV systolic function; biatrial enlargement; trace AI; mild  MR; moderate TR; mildly elevated pulmonary pressure. -Carotid dopplers - bilateral 1-39% ICA stenosis, antegrade vertebral artery flow, thrombus in R subclavian -Prophylactic therapy-Anticoagulation: Coumadin changed to Eliquis per neuro -TSH, HIV antibody and RPR normal.  HLD LDL 64, goal <60 Continue Lipitor 40mg   B 12 deficiency Replete and maintain on replacement therapy  A. fib -Rate controlled.  -Previously on Coumadin. -Patient's daughter clearly states that patient has specifically stated in the past that he would rather bleed to death on anticoagulant than to have a serious stroke that leaves him debilitated. They would like to continue anticoagulation.  -Will change to Eliquis for ease in dosing, 2.5 mg daily as per Dr. Leonie Man.   Hypokalemia Replenished  Essential hypertension Continue home medications  CAD Continue beta blocker and statin  Right subclavian artery occlusion -Vascular surgery consult, thought to be chronic in  nature -Continue Eliquis -No further vascular evaluation is needed -Always check BPs in left arm   Procedures:  EEG  Echo  Carotid dopplers  Consultations:  Neurology  Vascular surgery  Discharge Exam: Vitals:   02/19/16 0924 02/19/16 1438  BP: 106/80 (!) 148/74  Pulse: 72 69  Resp: 19 20  Temp: 98.7 F (37.1 C) 98.6 F (37 C)     General: Well developed, well nourished, NAD, appears stated age  HEENT: NCAT, PERRLA, EOMI, Anicteic Sclera, mucous membranes moist.  Neck: Supple, no JVD, no masses  Cardiovascular: S1 S2 auscultated, no rubs, murmurs or gallops. Regular rate and rhythm.  Respiratory: Clear to auscultation bilaterally with equal chest rise  Abdomen: Soft, nontender, nondistended, + bowel sounds  Extremities: warm dry without cyanosis clubbing or edema  Neuro: AAOx3, cranial nerves grossly intact. Strength 5/5 in patient's upper and lower extremities bilaterally  Skin: Without rashes exudates or nodules  Psych: Normal affect and demeanor with intact judgement and insight  Discharge Instructions  You have been cared for in the hospital for a stroke.  Instructions are as follows: 1. Stroke - some improvement in functioning/confusion during hospitalization.  This is expected to slowly improve some over time, although it is not clear whether it will go away completely. -Your hemoglobin A1c was minimally elevated.  Given your age, you do  Not require treatment for diabetes at this time. -Your LDL (bad cholesterol) is at goal; continue taking Lipitor.  Your HDL (good cholesterol) is low; you could add fish oil if desired. -You were taking Coumadin but this has been changed to Eliquis.  See medication information below. -Physical/occupation/speech therapy all recommend home services. -Other equipment for home use has also been ordered (rolling walker, 3 in 1 commode, tub seat, wheelchair). -You are recommended to have 24 hour supervision.  2. Blood  clot in your right subclavian artery - seen by vascular surgery, likely chronic -Check blood pressures in your left arm.  Discharge Instructions    Ambulatory referral to Neurology    Complete by:  As directed   Dr. Leonie Man  requests follow up for this patient in 2 months.   Call MD for:  difficulty breathing, headache or visual disturbances    Complete by:  As directed   Call MD for:  persistant dizziness or light-headedness    Complete by:  As directed   Diet - low sodium heart healthy    Complete by:  As directed   Discharge instructions    Complete by:  As directed   You have been cared for in the hospital for a stroke.  Instructions are as follows: 1. Stroke - some improvement in functioning/confusion during hospitalization.  This is expected to slowly improve some over time, although it is not clear whether it will go away completely. -Your hemoglobin A1c was minimally elevated.  Given your age, you do  Not require treatment for diabetes at this time. -Your LDL (bad cholesterol) is at goal; continue taking Lipitor.  Your HDL (good cholesterol) is low; you could add fish oil if desired. -You were taking Coumadin but this has been changed to Eliquis.  See medication information below. -Physical/occupation/speech therapy all recommend home services. -Other equipment for home use has also been ordered (rolling walker, 3 in 1 commode, tub seat, wheelchair). -You are recommended to have 24 hour supervision.  2. Blood clot in your right subclavian artery - seen  by vascular surgery, likely chronic -Check blood pressures in your left arm.   Increase activity slowly    Complete by:  As directed       Medication List    STOP taking these medications   warfarin 5 MG tablet Commonly known as:  COUMADIN     TAKE these medications   apixaban 2.5 MG Tabs tablet Commonly known as:  ELIQUIS Take 1 tablet (2.5 mg total) by mouth 2 (two) times daily.   atorvastatin 40 MG tablet Commonly known  as:  LIPITOR Take 40 mg by mouth every other day.   cyanocobalamin 1000 MCG tablet Take 1 tablet (1,000 mcg total) by mouth daily.   diphenhydrAMINE 25 MG tablet Commonly known as:  BENADRYL Take 25 mg by mouth every 6 (six) hours as needed for itching or allergies.   isosorbide mononitrate 60 MG 24 hr tablet Commonly known as:  IMDUR Take 60 mg by mouth daily before breakfast.   metoprolol 50 MG tablet Commonly known as:  LOPRESSOR Take 75 mg by mouth 2 (two) times daily.   multivitamin with minerals Tabs tablet Take 1 tablet by mouth daily.   nitroGLYCERIN 0.4 MG SL tablet Commonly known as:  NITROSTAT Place 1 tablet (0.4 mg total) under the tongue every 5 (five) minutes as needed for chest pain.   ramipril 10 MG capsule Commonly known as:  ALTACE Take 10 mg by mouth daily.      No Known Allergies Follow-up Information    SETHI,PRAMOD, MD. Schedule an appointment as soon as possible for a visit in 2 months.   Specialties:  Neurology, Radiology Contact information: [address] [place] [address] [place], MD In 1 week.   Specialty:  Internal Medicine Why:  hospital follow-up Contact information: [place] [address] [phone]        Inc[facility] - [facility].   Why:  wheelchair, 3n1 and rolling walker Contact information: [address] [place]  [address] [phone]            The results of significant diagnostics from this hospitalization (including imaging, microbiology, ancillary and laboratory) are listed below for reference.    Significant Diagnostic Studies: Dg Eye Foreign Body  Result Date: 02/16/2016 CLINICAL DATA:  Metal working/exposure; clearance prior to MRI EXAM: ORBITS FOR FOREIGN BODY - 2 VIEW COMPARISON:  None. FINDINGS: There is no evidence of metallic foreign body within the orbits. No significant bone abnormality identified.  IMPRESSION: No evidence of metallic foreign body within the orbits. Electronically Signed   By: Claudie Revering M.D.   On: 02/16/2016 13:37   Mr Virgel Paling [id number] Contrast  Result Date: 02/17/2016 CLINICAL DATA:  82 year old hypertensive male with altered mental status. Atrial fibrillation on Coumadin. Recent bilateral acute infarcts noted. Subsequent encounter. EXAM: MRA HEAD WITHOUT CONTRAST TECHNIQUE: Angiographic images of the Circle of Willis were obtained using MRA technique without intravenous contrast. COMPARISON:  02/16/2016 brain MR. 02/15/2016 CT. FINDINGS: Abnormal appearance of the left internal carotid artery petrous segment and supraclinoid segment with significant narrowing which may reflect result of dissection and/ or atherosclerotic disease. Flow was seen within the left internal carotid artery cavernous segment however there is a high grade (almost completely occluding) stenosis at the junction of the left internal carotid artery cavernous and supraclinoid segment. Decreased signal intensity of left carotid bifurcation and left middle cerebral artery may indicate that flow to this  region is inadequate secondary to the proximal stenosis. Moderate plaque right internal carotid artery petrous segment with mild narrowing. Mild to moderate focal narrowing mid aspect M1 segment right middle cerebral artery. Mild narrowing left vertebral artery. Mild irregularity and narrowing of portions of the posterior inferior cerebellar artery bilaterally. Mild irregularity basilar artery without high-grade stenosis. Nonvisualized anterior inferior cerebellar arteries. Moderate narrowing right superior cerebellar artery. Minimal deposition, laminar necrosis or blood breakdown products right globus pallidus unchanged. IMPRESSION: Abnormal appearance of the left internal carotid artery petrous segment and supraclinoid segment with significant narrowing which may reflect result of dissection and/ or atherosclerotic  disease. High-grade (almost completely occluding) stenosis at the junction of the left internal carotid artery cavernous and supraclinoid segment. Decreased signal intensity of left carotid bifurcation and left middle cerebral artery may indicate that flow to this region is inadequate secondary to the proximal stenosis. Moderate plaque right internal carotid artery petrous segment with mild narrowing. Mild to moderate focal narrowing mid aspect M1 segment right middle cerebral artery. Mild narrowing left vertebral artery. Mild irregularity and narrowing of portions of the posterior inferior cerebellar artery bilaterally. Mild irregularity basilar artery without high-grade stenosis. Nonvisualized anterior inferior cerebellar arteries. Moderate narrowing right superior cerebellar artery. Electronically Signed   By: Genia Del M.D.   On: 02/17/2016 16:01   Mr Brain Wo Contrast  Result Date: 02/16/2016 CLINICAL DATA:  Acute encephalopathy.  Atrial fib on Coumadin EXAM: MRI HEAD WITHOUT CONTRAST TECHNIQUE: Multiplanar, multiecho pulse sequences of the brain and surrounding structures were obtained without intravenous contrast. COMPARISON:  CT 02/15/2016 FINDINGS: Moderate to advanced cerebral atrophy.  Negative for hydrocephalus. Image quality degraded by motion. Multiple areas of acute infarction. This small area of acute infarct in the right lateral cerebellum. Acute infarct left superior cerebellum. Patchy areas of acute infarct in the occipital lobes bilaterally. Acute infarct left thalamus and right internal capsule. No significant chronic ischemic change. Negative for hemorrhage or fluid collection Negative for mass or edema.  No shift of the midline structures Minimal mucosal edema in the paranasal sinuses. Normal orbital contents. Pituitary not enlarged. IMPRESSION: Image quality degraded by motion Multiple areas of acute infarct. These are in the posterior circulation and include the cerebellum  bilaterally. Bilateral occipital lobes, and left thalamus. Possible acute infarct also in the right internal capsule. Moderate to advanced atrophy Electronically Signed   By: Franchot Gallo M.D.   On: 02/16/2016 14:24    Microbiology: No results found for this or any previous visit (from the past 240 hour(s)).   Labs: Basic Metabolic Panel: No results for input(s): NA, K, CL, CO2, GLUCOSE, BUN, CREATININE, CALCIUM, MG, PHOS in the last 168 hours. Liver Function Tests: No results for input(s): AST, ALT, ALKPHOS, BILITOT, PROT, ALBUMIN in the last 168 hours. No results for input(s): LIPASE, AMYLASE in the last 168 hours. No results for input(s): AMMONIA in the last 168 hours. CBC: No results for input(s): WBC, NEUTROABS, HGB, HCT, MCV, PLT in the last 168 hours. Cardiac Enzymes: No results for input(s): CKTOTAL, CKMB, CKMBINDEX, TROPONINI in the last 168 hours. BNP: BNP (last 3 results) No results for input(s): BNP in the last 8760 hours.  ProBNP (last 3 results) No results for input(s): PROBNP in the last 8760 hours.  CBG: No results for input(s): GLUCAP in the last 168 hours.     Signed:  Karmen Bongo  [facility] 03/16/2016, 1:28 PM

## 2016-02-19 NOTE — Progress Notes (Signed)
  Echocardiogram 2D Echocardiogram has been performed.  Darlina Sicilian M 02/19/2016, 9:30 AM

## 2016-02-19 NOTE — Care Management Note (Signed)
Case Management Note  Patient Details  Name: Craig Richard MRN: [id number] Date of Birth: 1921/11/07  Subjective/Objective:                  Acute cerebrovascular accident (CVA) [facility]) Action/Plan: Discharge  planning Expected Discharge Date:  02/19/16               Expected Discharge Plan:  [place]  In-House Referral:     Discharge planning Services  CM Consult  Post Acute Care Choice:  Home Health Choice offered to:  Patient, Adult Children  DME Arranged:  Walker rolling, Wheelchair manual DME Agency:  [place] Arranged:  PT, OT, Speech Therapy [place] Agency:  [place]  Status of Service:  Completed, signed off  If discussed at [place] of Stay Meetings, dates discussed:    Additional Comments: Cm met with pt in room to offer choice of home health agency.  Pt chooses [facility] to render HHPT/OT/SLP.  Referral called to [facility] rep, Tiffany.  Cm called [facility] DME rep, Germaine to please delive rthe rolling walker, 3n1 and wheelchair to room so pt can discharge.  CM gave pt free 30 day trial card for Eliquis and family verbalized understanding this will cover today's prescription and give insurance enough time to authorize for refills.  No other CM needs were communicated.  Dellie Catholic, RN 02/19/2016, 3:28 PM

## 2016-02-19 NOTE — Progress Notes (Signed)
RN spoke with Dr. Scot Dock. BP should be taken in left arm. Patient is forgetful, family was notified, information put in AVS which will be given to family

## 2016-02-19 NOTE — Progress Notes (Signed)
Discharge instructions received. RN discussed discharge instructions with patient family including no vascular intervention, take BPs in left arm. Understands follow up appointments, s/sx of stroke, vocalized understanding of eliquis indication, dosage, route, s/sx complication. Neuro assessment is unchanged, patient denies any pain. Tele d/c, iv removed. Patient is waiting for equipment. Patient will be assisted to car by family and staff.

## 2016-02-19 NOTE — Progress Notes (Signed)
VASCULAR LAB PRELIMINARY  PRELIMINARY  PRELIMINARY  PRELIMINARY  Carotid duplex completed.    Preliminary report:  Bilateral:  1-39% ICA stenosis.  Vertebral artery flow is antegrade.  The right subclavian artery waveform is diminished and spiked.  There is little color flow.  The right brachial artery has color and a monophasic waveform.     Ellesse Antenucci, RVT 02/19/2016, 10:44 AM

## 2016-02-20 DIAGNOSIS — E785 Hyperlipidemia, unspecified: Secondary | ICD-10-CM | POA: Diagnosis not present

## 2016-02-20 DIAGNOSIS — R2689 Other abnormalities of gait and mobility: Secondary | ICD-10-CM | POA: Diagnosis not present

## 2016-02-20 DIAGNOSIS — I10 Essential (primary) hypertension: Secondary | ICD-10-CM | POA: Diagnosis not present

## 2016-02-20 DIAGNOSIS — I4891 Unspecified atrial fibrillation: Secondary | ICD-10-CM | POA: Diagnosis not present

## 2016-02-20 DIAGNOSIS — I69398 Other sequelae of cerebral infarction: Secondary | ICD-10-CM | POA: Diagnosis not present

## 2016-02-20 DIAGNOSIS — Z7901 Long term (current) use of anticoagulants: Secondary | ICD-10-CM | POA: Diagnosis not present

## 2016-02-20 DIAGNOSIS — E559 Vitamin D deficiency, unspecified: Secondary | ICD-10-CM | POA: Diagnosis not present

## 2016-02-20 DIAGNOSIS — I2510 Atherosclerotic heart disease of native coronary artery without angina pectoris: Secondary | ICD-10-CM | POA: Diagnosis not present

## 2016-02-20 DIAGNOSIS — I69311 Memory deficit following cerebral infarction: Secondary | ICD-10-CM | POA: Diagnosis not present

## 2016-02-20 DIAGNOSIS — Z9181 History of falling: Secondary | ICD-10-CM | POA: Diagnosis not present

## 2016-02-20 DIAGNOSIS — Z9861 Coronary angioplasty status: Secondary | ICD-10-CM | POA: Diagnosis not present

## 2016-02-20 DIAGNOSIS — F17220 Nicotine dependence, chewing tobacco, uncomplicated: Secondary | ICD-10-CM | POA: Diagnosis not present

## 2016-02-20 DIAGNOSIS — I252 Old myocardial infarction: Secondary | ICD-10-CM | POA: Diagnosis not present

## 2016-02-20 LAB — VAS US CAROTID
LCCADSYS: 41 cm/s
LCCAPDIAS: 8 cm/s
LCCAPSYS: 67 cm/s
LEFT ECA DIAS: -6 cm/s
LEFT VERTEBRAL DIAS: 11 cm/s
LICADDIAS: -13 cm/s
Left CCA dist dias: 8 cm/s
Left ICA dist sys: -40 cm/s
Left ICA prox dias: 8 cm/s
Left ICA prox sys: 42 cm/s
RCCAPDIAS: 13 cm/s
RIGHT ECA DIAS: -7 cm/s
RIGHT VERTEBRAL DIAS: 14 cm/s
Right CCA prox sys: 53 cm/s
Right cca dist sys: -102 cm/s

## 2016-02-22 DIAGNOSIS — R2689 Other abnormalities of gait and mobility: Secondary | ICD-10-CM | POA: Diagnosis not present

## 2016-02-22 DIAGNOSIS — I10 Essential (primary) hypertension: Secondary | ICD-10-CM | POA: Diagnosis not present

## 2016-02-22 DIAGNOSIS — I69398 Other sequelae of cerebral infarction: Secondary | ICD-10-CM | POA: Diagnosis not present

## 2016-02-22 DIAGNOSIS — I69311 Memory deficit following cerebral infarction: Secondary | ICD-10-CM | POA: Diagnosis not present

## 2016-02-22 DIAGNOSIS — I4891 Unspecified atrial fibrillation: Secondary | ICD-10-CM | POA: Diagnosis not present

## 2016-02-22 DIAGNOSIS — I2510 Atherosclerotic heart disease of native coronary artery without angina pectoris: Secondary | ICD-10-CM | POA: Diagnosis not present

## 2016-02-24 DIAGNOSIS — R2689 Other abnormalities of gait and mobility: Secondary | ICD-10-CM | POA: Diagnosis not present

## 2016-02-24 DIAGNOSIS — I69311 Memory deficit following cerebral infarction: Secondary | ICD-10-CM | POA: Diagnosis not present

## 2016-02-24 DIAGNOSIS — I10 Essential (primary) hypertension: Secondary | ICD-10-CM | POA: Diagnosis not present

## 2016-02-24 DIAGNOSIS — I69398 Other sequelae of cerebral infarction: Secondary | ICD-10-CM | POA: Diagnosis not present

## 2016-02-24 DIAGNOSIS — I4891 Unspecified atrial fibrillation: Secondary | ICD-10-CM | POA: Diagnosis not present

## 2016-02-24 DIAGNOSIS — I2510 Atherosclerotic heart disease of native coronary artery without angina pectoris: Secondary | ICD-10-CM | POA: Diagnosis not present

## 2016-02-27 DIAGNOSIS — I10 Essential (primary) hypertension: Secondary | ICD-10-CM | POA: Diagnosis not present

## 2016-02-27 DIAGNOSIS — I2510 Atherosclerotic heart disease of native coronary artery without angina pectoris: Secondary | ICD-10-CM | POA: Diagnosis not present

## 2016-02-27 DIAGNOSIS — I4891 Unspecified atrial fibrillation: Secondary | ICD-10-CM | POA: Diagnosis not present

## 2016-02-27 DIAGNOSIS — Z09 Encounter for follow-up examination after completed treatment for conditions other than malignant neoplasm: Secondary | ICD-10-CM | POA: Diagnosis not present

## 2016-02-27 DIAGNOSIS — I69311 Memory deficit following cerebral infarction: Secondary | ICD-10-CM | POA: Diagnosis not present

## 2016-02-27 DIAGNOSIS — I6310 Cerebral infarction due to embolism of unspecified precerebral artery: Secondary | ICD-10-CM | POA: Diagnosis not present

## 2016-02-27 DIAGNOSIS — I69398 Other sequelae of cerebral infarction: Secondary | ICD-10-CM | POA: Diagnosis not present

## 2016-02-27 DIAGNOSIS — I482 Chronic atrial fibrillation: Secondary | ICD-10-CM | POA: Diagnosis not present

## 2016-02-27 DIAGNOSIS — R2689 Other abnormalities of gait and mobility: Secondary | ICD-10-CM | POA: Diagnosis not present

## 2016-02-28 DIAGNOSIS — I69311 Memory deficit following cerebral infarction: Secondary | ICD-10-CM | POA: Diagnosis not present

## 2016-02-28 DIAGNOSIS — I2510 Atherosclerotic heart disease of native coronary artery without angina pectoris: Secondary | ICD-10-CM | POA: Diagnosis not present

## 2016-02-28 DIAGNOSIS — R2689 Other abnormalities of gait and mobility: Secondary | ICD-10-CM | POA: Diagnosis not present

## 2016-02-28 DIAGNOSIS — I69398 Other sequelae of cerebral infarction: Secondary | ICD-10-CM | POA: Diagnosis not present

## 2016-02-28 DIAGNOSIS — I10 Essential (primary) hypertension: Secondary | ICD-10-CM | POA: Diagnosis not present

## 2016-02-28 DIAGNOSIS — I4891 Unspecified atrial fibrillation: Secondary | ICD-10-CM | POA: Diagnosis not present

## 2016-02-29 DIAGNOSIS — R2689 Other abnormalities of gait and mobility: Secondary | ICD-10-CM | POA: Diagnosis not present

## 2016-02-29 DIAGNOSIS — I2510 Atherosclerotic heart disease of native coronary artery without angina pectoris: Secondary | ICD-10-CM | POA: Diagnosis not present

## 2016-02-29 DIAGNOSIS — I4891 Unspecified atrial fibrillation: Secondary | ICD-10-CM | POA: Diagnosis not present

## 2016-02-29 DIAGNOSIS — I69311 Memory deficit following cerebral infarction: Secondary | ICD-10-CM | POA: Diagnosis not present

## 2016-02-29 DIAGNOSIS — I10 Essential (primary) hypertension: Secondary | ICD-10-CM | POA: Diagnosis not present

## 2016-02-29 DIAGNOSIS — I69398 Other sequelae of cerebral infarction: Secondary | ICD-10-CM | POA: Diagnosis not present

## 2016-03-02 DIAGNOSIS — I4891 Unspecified atrial fibrillation: Secondary | ICD-10-CM | POA: Diagnosis not present

## 2016-03-02 DIAGNOSIS — I10 Essential (primary) hypertension: Secondary | ICD-10-CM | POA: Diagnosis not present

## 2016-03-02 DIAGNOSIS — I69398 Other sequelae of cerebral infarction: Secondary | ICD-10-CM | POA: Diagnosis not present

## 2016-03-02 DIAGNOSIS — R2689 Other abnormalities of gait and mobility: Secondary | ICD-10-CM | POA: Diagnosis not present

## 2016-03-02 DIAGNOSIS — I69311 Memory deficit following cerebral infarction: Secondary | ICD-10-CM | POA: Diagnosis not present

## 2016-03-02 DIAGNOSIS — I2510 Atherosclerotic heart disease of native coronary artery without angina pectoris: Secondary | ICD-10-CM | POA: Diagnosis not present

## 2016-03-08 DIAGNOSIS — I69398 Other sequelae of cerebral infarction: Secondary | ICD-10-CM | POA: Diagnosis not present

## 2016-03-08 DIAGNOSIS — R2689 Other abnormalities of gait and mobility: Secondary | ICD-10-CM | POA: Diagnosis not present

## 2016-03-08 DIAGNOSIS — I2510 Atherosclerotic heart disease of native coronary artery without angina pectoris: Secondary | ICD-10-CM | POA: Diagnosis not present

## 2016-03-12 DIAGNOSIS — I10 Essential (primary) hypertension: Secondary | ICD-10-CM | POA: Diagnosis not present

## 2016-03-12 DIAGNOSIS — I4891 Unspecified atrial fibrillation: Secondary | ICD-10-CM | POA: Diagnosis not present

## 2016-03-12 DIAGNOSIS — I69398 Other sequelae of cerebral infarction: Secondary | ICD-10-CM | POA: Diagnosis not present

## 2016-03-12 DIAGNOSIS — I69311 Memory deficit following cerebral infarction: Secondary | ICD-10-CM | POA: Diagnosis not present

## 2016-03-12 DIAGNOSIS — R2689 Other abnormalities of gait and mobility: Secondary | ICD-10-CM | POA: Diagnosis not present

## 2016-03-12 DIAGNOSIS — I2510 Atherosclerotic heart disease of native coronary artery without angina pectoris: Secondary | ICD-10-CM | POA: Diagnosis not present

## 2016-03-14 DIAGNOSIS — I10 Essential (primary) hypertension: Secondary | ICD-10-CM | POA: Diagnosis not present

## 2016-03-14 DIAGNOSIS — I69398 Other sequelae of cerebral infarction: Secondary | ICD-10-CM | POA: Diagnosis not present

## 2016-03-14 DIAGNOSIS — I4891 Unspecified atrial fibrillation: Secondary | ICD-10-CM | POA: Diagnosis not present

## 2016-03-14 DIAGNOSIS — I69311 Memory deficit following cerebral infarction: Secondary | ICD-10-CM | POA: Diagnosis not present

## 2016-03-14 DIAGNOSIS — R2689 Other abnormalities of gait and mobility: Secondary | ICD-10-CM | POA: Diagnosis not present

## 2016-03-14 DIAGNOSIS — I2510 Atherosclerotic heart disease of native coronary artery without angina pectoris: Secondary | ICD-10-CM | POA: Diagnosis not present

## 2016-03-16 DIAGNOSIS — R2689 Other abnormalities of gait and mobility: Secondary | ICD-10-CM | POA: Diagnosis not present

## 2016-03-16 DIAGNOSIS — I4891 Unspecified atrial fibrillation: Secondary | ICD-10-CM | POA: Diagnosis not present

## 2016-03-16 DIAGNOSIS — I10 Essential (primary) hypertension: Secondary | ICD-10-CM | POA: Diagnosis not present

## 2016-03-16 DIAGNOSIS — I69311 Memory deficit following cerebral infarction: Secondary | ICD-10-CM | POA: Diagnosis not present

## 2016-03-16 DIAGNOSIS — I69398 Other sequelae of cerebral infarction: Secondary | ICD-10-CM | POA: Diagnosis not present

## 2016-03-16 DIAGNOSIS — I2510 Atherosclerotic heart disease of native coronary artery without angina pectoris: Secondary | ICD-10-CM | POA: Diagnosis not present

## 2016-03-19 DIAGNOSIS — R2689 Other abnormalities of gait and mobility: Secondary | ICD-10-CM | POA: Diagnosis not present

## 2016-03-19 DIAGNOSIS — I69311 Memory deficit following cerebral infarction: Secondary | ICD-10-CM | POA: Diagnosis not present

## 2016-03-19 DIAGNOSIS — I2510 Atherosclerotic heart disease of native coronary artery without angina pectoris: Secondary | ICD-10-CM | POA: Diagnosis not present

## 2016-03-19 DIAGNOSIS — I4891 Unspecified atrial fibrillation: Secondary | ICD-10-CM | POA: Diagnosis not present

## 2016-03-19 DIAGNOSIS — I10 Essential (primary) hypertension: Secondary | ICD-10-CM | POA: Diagnosis not present

## 2016-03-19 DIAGNOSIS — I69398 Other sequelae of cerebral infarction: Secondary | ICD-10-CM | POA: Diagnosis not present

## 2016-03-21 DIAGNOSIS — I2510 Atherosclerotic heart disease of native coronary artery without angina pectoris: Secondary | ICD-10-CM | POA: Diagnosis not present

## 2016-03-21 DIAGNOSIS — E119 Type 2 diabetes mellitus without complications: Secondary | ICD-10-CM | POA: Diagnosis not present

## 2016-03-21 DIAGNOSIS — E782 Mixed hyperlipidemia: Secondary | ICD-10-CM | POA: Diagnosis not present

## 2016-03-21 DIAGNOSIS — I6310 Cerebral infarction due to embolism of unspecified precerebral artery: Secondary | ICD-10-CM | POA: Diagnosis not present

## 2016-03-22 DIAGNOSIS — R2689 Other abnormalities of gait and mobility: Secondary | ICD-10-CM | POA: Diagnosis not present

## 2016-03-22 DIAGNOSIS — I10 Essential (primary) hypertension: Secondary | ICD-10-CM | POA: Diagnosis not present

## 2016-03-22 DIAGNOSIS — I2510 Atherosclerotic heart disease of native coronary artery without angina pectoris: Secondary | ICD-10-CM | POA: Diagnosis not present

## 2016-03-22 DIAGNOSIS — I69398 Other sequelae of cerebral infarction: Secondary | ICD-10-CM | POA: Diagnosis not present

## 2016-03-22 DIAGNOSIS — I69311 Memory deficit following cerebral infarction: Secondary | ICD-10-CM | POA: Diagnosis not present

## 2016-03-22 DIAGNOSIS — I4891 Unspecified atrial fibrillation: Secondary | ICD-10-CM | POA: Diagnosis not present

## 2016-03-23 DIAGNOSIS — I69398 Other sequelae of cerebral infarction: Secondary | ICD-10-CM | POA: Diagnosis not present

## 2016-03-23 DIAGNOSIS — I10 Essential (primary) hypertension: Secondary | ICD-10-CM | POA: Diagnosis not present

## 2016-03-23 DIAGNOSIS — R2689 Other abnormalities of gait and mobility: Secondary | ICD-10-CM | POA: Diagnosis not present

## 2016-03-23 DIAGNOSIS — I4891 Unspecified atrial fibrillation: Secondary | ICD-10-CM | POA: Diagnosis not present

## 2016-03-23 DIAGNOSIS — I69311 Memory deficit following cerebral infarction: Secondary | ICD-10-CM | POA: Diagnosis not present

## 2016-03-23 DIAGNOSIS — I2510 Atherosclerotic heart disease of native coronary artery without angina pectoris: Secondary | ICD-10-CM | POA: Diagnosis not present

## 2016-03-26 DIAGNOSIS — I69398 Other sequelae of cerebral infarction: Secondary | ICD-10-CM | POA: Diagnosis not present

## 2016-03-26 DIAGNOSIS — R2689 Other abnormalities of gait and mobility: Secondary | ICD-10-CM | POA: Diagnosis not present

## 2016-03-26 DIAGNOSIS — I2510 Atherosclerotic heart disease of native coronary artery without angina pectoris: Secondary | ICD-10-CM | POA: Diagnosis not present

## 2016-03-26 DIAGNOSIS — I10 Essential (primary) hypertension: Secondary | ICD-10-CM | POA: Diagnosis not present

## 2016-03-26 DIAGNOSIS — I4891 Unspecified atrial fibrillation: Secondary | ICD-10-CM | POA: Diagnosis not present

## 2016-03-26 DIAGNOSIS — I69311 Memory deficit following cerebral infarction: Secondary | ICD-10-CM | POA: Diagnosis not present

## 2016-03-28 DIAGNOSIS — I10 Essential (primary) hypertension: Secondary | ICD-10-CM | POA: Diagnosis not present

## 2016-03-28 DIAGNOSIS — R2689 Other abnormalities of gait and mobility: Secondary | ICD-10-CM | POA: Diagnosis not present

## 2016-03-28 DIAGNOSIS — I69311 Memory deficit following cerebral infarction: Secondary | ICD-10-CM | POA: Diagnosis not present

## 2016-03-28 DIAGNOSIS — I69398 Other sequelae of cerebral infarction: Secondary | ICD-10-CM | POA: Diagnosis not present

## 2016-03-28 DIAGNOSIS — I2510 Atherosclerotic heart disease of native coronary artery without angina pectoris: Secondary | ICD-10-CM | POA: Diagnosis not present

## 2016-03-28 DIAGNOSIS — I4891 Unspecified atrial fibrillation: Secondary | ICD-10-CM | POA: Diagnosis not present

## 2016-04-03 DIAGNOSIS — I2510 Atherosclerotic heart disease of native coronary artery without angina pectoris: Secondary | ICD-10-CM | POA: Diagnosis not present

## 2016-04-03 DIAGNOSIS — I69311 Memory deficit following cerebral infarction: Secondary | ICD-10-CM | POA: Diagnosis not present

## 2016-04-03 DIAGNOSIS — I69398 Other sequelae of cerebral infarction: Secondary | ICD-10-CM | POA: Diagnosis not present

## 2016-04-03 DIAGNOSIS — I4891 Unspecified atrial fibrillation: Secondary | ICD-10-CM | POA: Diagnosis not present

## 2016-04-03 DIAGNOSIS — R2689 Other abnormalities of gait and mobility: Secondary | ICD-10-CM | POA: Diagnosis not present

## 2016-04-03 DIAGNOSIS — I10 Essential (primary) hypertension: Secondary | ICD-10-CM | POA: Diagnosis not present

## 2016-04-05 ENCOUNTER — Encounter: Payer: Self-pay | Admitting: Neurology

## 2016-04-05 ENCOUNTER — Ambulatory Visit (INDEPENDENT_AMBULATORY_CARE_PROVIDER_SITE_OTHER): Payer: Medicare Other | Admitting: Neurology

## 2016-04-05 VITALS — BP 136/80 | HR 68 | Ht 70.0 in | Wt 160.8 lb

## 2016-04-05 DIAGNOSIS — I2510 Atherosclerotic heart disease of native coronary artery without angina pectoris: Secondary | ICD-10-CM | POA: Diagnosis not present

## 2016-04-05 DIAGNOSIS — I63119 Cerebral infarction due to embolism of unspecified vertebral artery: Secondary | ICD-10-CM | POA: Diagnosis not present

## 2016-04-05 DIAGNOSIS — R2689 Other abnormalities of gait and mobility: Secondary | ICD-10-CM | POA: Diagnosis not present

## 2016-04-05 DIAGNOSIS — I69311 Memory deficit following cerebral infarction: Secondary | ICD-10-CM | POA: Diagnosis not present

## 2016-04-05 DIAGNOSIS — I639 Cerebral infarction, unspecified: Secondary | ICD-10-CM | POA: Diagnosis not present

## 2016-04-05 DIAGNOSIS — I69398 Other sequelae of cerebral infarction: Secondary | ICD-10-CM | POA: Diagnosis not present

## 2016-04-05 DIAGNOSIS — I10 Essential (primary) hypertension: Secondary | ICD-10-CM | POA: Diagnosis not present

## 2016-04-05 DIAGNOSIS — I4891 Unspecified atrial fibrillation: Secondary | ICD-10-CM | POA: Diagnosis not present

## 2016-04-05 NOTE — Progress Notes (Signed)
[facility] [address] [place] [address] [phone]       OFFICE FOLLOW-UP NOTE  Mr. Craig Richard Date of Birth:  09-02-1921 Medical Record Number:  [id number]   HPI: Mr Kenon is a pleasant elderly Caucasian male seen today for first office follow-up visit following hospital admission for stroke in June 2017. He is accompanied today by his 2 daughters. AIDRIK KELSOE is an 82 y.o. male with a history of atrial fibrillation on anticoagulation, hypertension, hyperlipidemia and coronary artery disease, admitted to [facility] on 02/15/2016 for acute altered mental status with confusion. Patient reportedly is usually of sound mind and independent and able to care for himself. CT scan on 02/15/2016 was unremarkable for acute changes. MRI on 02/16/2016 showed multiple areas of acute infarction involving cerebellum as well as right and left occipital regions. Patient has not experienced visual changes nor vertigo or nausea. He is on Coumadin and his INR on admission was 1.88. MRI on 02/16/2016 was 2.20. He had an EEG on 02/16/2016 which was normal. NIH stroke score at the time of this evaluation was 1. He was LKW 02/14/2016. Patient was not administered IV t-PA secondary to unclear exactly when he was last known well; no clear focal deficit. He was admitted for further evaluation and treatment. 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 Carotid ultrasound showed no significant extra cholesterolosis but right subclavian artery waveform is abnormal suggestive of distal stenosis or occlusion. Echo currently gram showed normal ejection fraction. EEG was normal. LDL cholesterol was 64 mg percent and and hemoglobin A1c was 5.7. Patient was switched from warfarin to eliquis for stroke prevention  prior to fibrillation. He will discharge him and states his done well. He has finished physical and occupation therapy as an outpatient. His balance and walking has improved. He is using a walker all the time. The daughters feel that the patient has lost some orientation of time but overall cognitively seems to bring on right. History living at home. He has a home aide now comes for a few hours every day. He has life alert button. He has a DO NOT RESUSCITATE on file. He is tolerating eliquis well without major bleeding but does get easy bruising. He needs help with some activities but does mostly independent.  ROS:   14 system review of systems is positive for  leg swelling, hearing loss, ringing in the ears, easy bruising and bleeding, runny nose and all other systems negative PMH:  Past Medical History:  Diagnosis Date  . Atrial fibrillation (HCC)    coumadin - follow by Reinaldo Meeker at [facility]  . CAD (coronary artery disease)   . History of anterior wall myocardial infarction 09/17/2008  . History of nuclear stress test 06/14/2010   bruce protocol myoview; stress images show perfusion defect in apical wall (scar)  . Hyperlipidemia   . Hypertension   . Stroke [facility])     Social History:  Social History   Social History  . Marital status: Widowed    Spouse name: N/A  . Number of children: 2  . Years of education: B.S. [employer]   Occupational History  .  Retired   Social History Main Topics  . Smoking status: Never Smoker  . Smokeless tobacco: Current User    Types: Chew     Comment:  occasionally  . Alcohol use No  . Drug use: No  . Sexual activity: Not on file   Other Topics Concern  . Not on file   Social History Narrative  . No narrative on file    Medications:   Current Outpatient Prescriptions on File Prior to Visit  Medication Sig Dispense Refill  . apixaban (ELIQUIS) 2.5 MG TABS tablet Take 1 tablet (2.5 mg total) by mouth 2 (two) times daily. 60 tablet 0  .  atorvastatin (LIPITOR) 40 MG tablet Take 40 mg by mouth every other day.    . diphenhydrAMINE (BENADRYL) 25 MG tablet Take 25 mg by mouth every 6 (six) hours as needed for itching or allergies.    . isosorbide mononitrate (IMDUR) 60 MG 24 hr tablet Take 60 mg by mouth daily before breakfast.    . metoprolol (LOPRESSOR) 50 MG tablet Take 75 mg by mouth 2 (two) times daily.    . nitroGLYCERIN (NITROSTAT) 0.4 MG SL tablet Place 1 tablet (0.4 mg total) under the tongue every 5 (five) minutes as needed for chest pain. 25 tablet 6  . ramipril (ALTACE) 10 MG capsule Take 10 mg by mouth daily.    . vitamin B-12 1000 MCG tablet Take 1 tablet (1,000 mcg total) by mouth daily. 30 tablet 0   No current facility-administered medications on file prior to visit.     Allergies:  No Known Allergies  Physical Exam General: Frail petite elderly Caucasian male, seated, in no evident distress Head: head normocephalic and atraumatic.  Neck: supple with no carotid or supraclavicular bruits Cardiovascular: regular rate and rhythm, no murmurs Musculoskeletal: no deformity Skin:  no rash/petichiae Vascular:  Normal pulses all extremities Vitals:   04/05/16 1527  BP: 136/80  Pulse: 68   Neurologic Exam Mental Status: Awake and fully alert. Oriented to place only. Recent and remote memory diminished. Attention span, concentration and fund of knowledge appropriate. Mood and affect appropriate.  Cranial Nerves: Fundoscopic exam reveals sharp disc margins. Pupils equal, briskly reactive to light. Extraocular movements full without nystagmus. Visual fields full to confrontation. Hearing diminished slightly bilaterally.. Facial sensation intact. Face, tongue, palate moves normally and symmetrically.  Motor: Normal bulk and tone. Normal strength in all tested extremity muscles. Sensory.: intact to touch ,pinprick .position and vibratory sensation.  Coordination: Rapid alternating movements normal in all extremities.  Finger-to-nose and heel-to-shin performed accurately bilaterally. Gait and Station: Arises from chair with slight difficulty. Stance is stooped. Gait demonstrates short steps and uses a walker. Not able  to heel, toe and tandem walk without difficulty.  Reflexes: 1+ and symmetric. Toes downgoing.   NIHSS  2 Modified Rankin  3  ASSESSMENT: 19 year Pleasant Caucasian male with by lateral posterior circulation embolic infarcts in June 2017 from atrial fibrillation was doing quite well despite his age and multiple strokes.    PLAN: I had a long d/w patient and his 2 daughters about his recent stroke, risk for recurrent stroke/TIAs, personally independently reviewed imaging studies and stroke evaluation results and answered questions.Continue Eliquis for atrial fibrillation for secondary stroke prevention and maintain strict control of hypertension with blood pressure goal below 130/90, diabetes with hemoglobin A1c goal below 6.5% and lipids with LDL cholesterol goal below 70 mg/dL. I also advised the patient to eat a healthy diet with plenty of whole grains, cereals, fruits and vegetables, exercise regularly and maintain ideal body weight. I encouraged him to use his cane at all times when walking to avoid falls and  injuries. We also discussed fall risk prevention precautions.  Followup in the future with stroke nurse practitioner in 6 months or call earlier if necessary. Greater than 50% of time during this 25 minute visit was spent on counseling,explanation of diagnosis, planning of further management, discussion with patient and family and coordination of care Antony Contras, MD  [facility] [address] [place], [place] [address]  Phone [phone] Fax [phone] Note: This document was prepared with digital dictation and possible smart phrase technology. Any transcriptional errors that result from this process are unintentional

## 2016-04-05 NOTE — Patient Instructions (Signed)
I had a long d/w patient and his 2 daughters about his recent stroke, risk for recurrent stroke/TIAs, personally independently reviewed imaging studies and stroke evaluation results and answered questions.Continue Eliquis for atrial fibrillation for secondary stroke prevention and maintain strict control of hypertension with blood pressure goal below 130/90, diabetes with hemoglobin A1c goal below 6.5% and lipids with LDL cholesterol goal below 70 mg/dL. I also advised the patient to eat a healthy diet with plenty of whole grains, cereals, fruits and vegetables, exercise regularly and maintain ideal body weight. I encouraged him to use his cane at all times when walking to avoid falls and injuries. We also discussed fall risk prevention precautions.  Followup in the future with stroke nurse practitioner in 6 months or call earlier if necessary.  Fall Prevention in the Home  Falls can cause injuries and can affect people from all age groups. There are many simple things that you can do to make your home safe and to help prevent falls. WHAT CAN I DO ON THE OUTSIDE OF MY HOME?  Regularly repair the edges of walkways and driveways and fix any cracks.  Remove high doorway thresholds.  Trim any shrubbery on the main path into your home.  Use bright outdoor lighting.  Clear walkways of debris and clutter, including tools and rocks.  Regularly check that handrails are securely fastened and in good repair. Both sides of any steps should have handrails.  Install guardrails along the edges of any raised decks or porches.  Have leaves, snow, and ice cleared regularly.  Use sand or salt on walkways during winter months.  In the garage, clean up any spills right away, including grease or oil spills. WHAT CAN I DO IN THE BATHROOM?  Use night lights.  Install grab bars by the toilet and in the tub and shower. Do not use towel bars as grab bars.  Use non-skid mats or decals on the floor of the tub or  shower.  If you need to sit down while you are in the shower, use a plastic, non-slip stool.Marland Kitchen  Keep the floor dry. Immediately clean up any water that spills on the floor.  Remove soap buildup in the tub or shower on a regular basis.  Attach bath mats securely with double-sided non-slip rug tape.  Remove throw rugs and other tripping hazards from the floor. WHAT CAN I DO IN THE BEDROOM?  Use night lights.  Make sure that a bedside light is easy to reach.  Do not use oversized bedding that drapes onto the floor.  Have a firm chair that has side arms to use for getting dressed.  Remove throw rugs and other tripping hazards from the floor. WHAT CAN I DO IN THE KITCHEN?   Clean up any spills right away.  Avoid walking on wet floors.  Place frequently used items in easy-to-reach places.  If you need to reach for something above you, use a sturdy step stool that has a grab bar.  Keep electrical cables out of the way.  Do not use floor polish or wax that makes floors slippery. If you have to use wax, make sure that it is non-skid floor wax.  Remove throw rugs and other tripping hazards from the floor. WHAT CAN I DO IN THE STAIRWAYS?  Do not leave any items on the stairs.  Make sure that there are handrails on both sides of the stairs. Fix handrails that are broken or loose. Make sure that handrails are as long  as the stairways.  Check any carpeting to make sure that it is firmly attached to the stairs. Fix any carpet that is loose or worn.  Avoid having throw rugs at the top or bottom of stairways, or secure the rugs with carpet tape to prevent them from moving.  Make sure that you have a light switch at the top of the stairs and the bottom of the stairs. If you do not have them, have them installed. WHAT ARE SOME OTHER FALL PREVENTION TIPS?  Wear closed-toe shoes that fit well and support your feet. Wear shoes that have rubber soles or low heels.  When you use a  stepladder, make sure that it is completely opened and that the sides are firmly locked. Have someone hold the ladder while you are using it. Do not climb a closed stepladder.  Add color or contrast paint or tape to grab bars and handrails in your home. Place contrasting color strips on the first and last steps.  Use mobility aids as needed, such as canes, walkers, scooters, and crutches.  Turn on lights if it is dark. Replace any light bulbs that burn out.  Set up furniture so that there are clear paths. Keep the furniture in the same spot.  Fix any uneven floor surfaces.  Choose a carpet design that does not hide the edge of steps of a stairway.  Be aware of any and all pets.  Review your medicines with your healthcare provider. Some medicines can cause dizziness or changes in blood pressure, which increase your risk of falling. Talk with your health care provider about other ways that you can decrease your risk of falls. This may include working with a physical therapist or trainer to improve your strength, balance, and endurance.   This information is not intended to replace advice given to you by your health care provider. Make sure you discuss any questions you have with your health care provider.   Document Released: 07/27/2002 Document Revised: 12/21/2014 Document Reviewed: 09/10/2014 Elsevier Interactive Patient Education [employer].

## 2016-05-17 DIAGNOSIS — E119 Type 2 diabetes mellitus without complications: Secondary | ICD-10-CM | POA: Diagnosis not present

## 2016-05-17 DIAGNOSIS — E782 Mixed hyperlipidemia: Secondary | ICD-10-CM | POA: Diagnosis not present

## 2016-05-17 DIAGNOSIS — Z7901 Long term (current) use of anticoagulants: Secondary | ICD-10-CM | POA: Diagnosis not present

## 2016-05-23 DIAGNOSIS — E782 Mixed hyperlipidemia: Secondary | ICD-10-CM | POA: Diagnosis not present

## 2016-05-23 DIAGNOSIS — R600 Localized edema: Secondary | ICD-10-CM | POA: Diagnosis not present

## 2016-05-23 DIAGNOSIS — Z23 Encounter for immunization: Secondary | ICD-10-CM | POA: Diagnosis not present

## 2016-05-23 DIAGNOSIS — E118 Type 2 diabetes mellitus with unspecified complications: Secondary | ICD-10-CM | POA: Diagnosis not present

## 2016-05-23 DIAGNOSIS — D509 Iron deficiency anemia, unspecified: Secondary | ICD-10-CM | POA: Diagnosis not present

## 2016-06-19 DIAGNOSIS — D509 Iron deficiency anemia, unspecified: Secondary | ICD-10-CM | POA: Diagnosis not present

## 2016-06-19 DIAGNOSIS — R600 Localized edema: Secondary | ICD-10-CM | POA: Diagnosis not present

## 2016-06-27 DIAGNOSIS — R600 Localized edema: Secondary | ICD-10-CM | POA: Diagnosis not present

## 2016-06-27 DIAGNOSIS — D509 Iron deficiency anemia, unspecified: Secondary | ICD-10-CM | POA: Diagnosis not present

## 2016-06-27 DIAGNOSIS — I2510 Atherosclerotic heart disease of native coronary artery without angina pectoris: Secondary | ICD-10-CM | POA: Diagnosis not present

## 2016-06-27 DIAGNOSIS — E118 Type 2 diabetes mellitus with unspecified complications: Secondary | ICD-10-CM | POA: Diagnosis not present

## 2016-08-31 DIAGNOSIS — E118 Type 2 diabetes mellitus with unspecified complications: Secondary | ICD-10-CM | POA: Diagnosis not present

## 2016-08-31 DIAGNOSIS — D509 Iron deficiency anemia, unspecified: Secondary | ICD-10-CM | POA: Diagnosis not present

## 2016-08-31 DIAGNOSIS — I2510 Atherosclerotic heart disease of native coronary artery without angina pectoris: Secondary | ICD-10-CM | POA: Diagnosis not present

## 2016-09-13 DIAGNOSIS — D509 Iron deficiency anemia, unspecified: Secondary | ICD-10-CM | POA: Diagnosis not present

## 2016-09-13 DIAGNOSIS — E118 Type 2 diabetes mellitus with unspecified complications: Secondary | ICD-10-CM | POA: Diagnosis not present

## 2016-09-13 DIAGNOSIS — E538 Deficiency of other specified B group vitamins: Secondary | ICD-10-CM | POA: Diagnosis not present

## 2016-09-13 DIAGNOSIS — E782 Mixed hyperlipidemia: Secondary | ICD-10-CM | POA: Diagnosis not present

## 2016-10-09 ENCOUNTER — Ambulatory Visit: Payer: Medicare Other | Admitting: Nurse Practitioner

## 2016-11-06 ENCOUNTER — Ambulatory Visit: Payer: Medicare Other | Admitting: Nurse Practitioner

## 2016-11-06 ENCOUNTER — Emergency Department (HOSPITAL_COMMUNITY): Payer: Medicare Other

## 2016-11-06 ENCOUNTER — Encounter (HOSPITAL_COMMUNITY): Payer: Self-pay | Admitting: Emergency Medicine

## 2016-11-06 ENCOUNTER — Emergency Department (HOSPITAL_COMMUNITY)
Admission: EM | Admit: 2016-11-06 | Discharge: 2016-11-06 | Disposition: A | Discharge status: Home / Self Care | Payer: Medicare Other | Attending: Emergency Medicine | Admitting: Emergency Medicine

## 2016-11-06 DIAGNOSIS — I10 Essential (primary) hypertension: Secondary | ICD-10-CM | POA: Insufficient documentation

## 2016-11-06 DIAGNOSIS — R269 Unspecified abnormalities of gait and mobility: Secondary | ICD-10-CM

## 2016-11-06 DIAGNOSIS — Y929 Unspecified place or not applicable: Secondary | ICD-10-CM | POA: Diagnosis not present

## 2016-11-06 DIAGNOSIS — Z79899 Other long term (current) drug therapy: Secondary | ICD-10-CM | POA: Diagnosis not present

## 2016-11-06 DIAGNOSIS — S0993XA Unspecified injury of face, initial encounter: Secondary | ICD-10-CM | POA: Diagnosis not present

## 2016-11-06 DIAGNOSIS — Z955 Presence of coronary angioplasty implant and graft: Secondary | ICD-10-CM | POA: Insufficient documentation

## 2016-11-06 DIAGNOSIS — I679 Cerebrovascular disease, unspecified: Secondary | ICD-10-CM

## 2016-11-06 DIAGNOSIS — Z8673 Personal history of transient ischemic attack (TIA), and cerebral infarction without residual deficits: Secondary | ICD-10-CM | POA: Diagnosis not present

## 2016-11-06 DIAGNOSIS — Y939 Activity, unspecified: Secondary | ICD-10-CM | POA: Diagnosis not present

## 2016-11-06 DIAGNOSIS — W19XXXA Unspecified fall, initial encounter: Secondary | ICD-10-CM | POA: Insufficient documentation

## 2016-11-06 DIAGNOSIS — F0390 Unspecified dementia without behavioral disturbance: Secondary | ICD-10-CM | POA: Insufficient documentation

## 2016-11-06 DIAGNOSIS — Y999 Unspecified external cause status: Secondary | ICD-10-CM | POA: Diagnosis not present

## 2016-11-06 DIAGNOSIS — R0602 Shortness of breath: Secondary | ICD-10-CM | POA: Diagnosis not present

## 2016-11-06 DIAGNOSIS — S0990XA Unspecified injury of head, initial encounter: Secondary | ICD-10-CM | POA: Diagnosis not present

## 2016-11-06 DIAGNOSIS — I2510 Atherosclerotic heart disease of native coronary artery without angina pectoris: Secondary | ICD-10-CM | POA: Insufficient documentation

## 2016-11-06 DIAGNOSIS — R404 Transient alteration of awareness: Secondary | ICD-10-CM | POA: Diagnosis not present

## 2016-11-06 DIAGNOSIS — Z7901 Long term (current) use of anticoagulants: Secondary | ICD-10-CM | POA: Diagnosis not present

## 2016-11-06 DIAGNOSIS — Y92099 Unspecified place in other non-institutional residence as the place of occurrence of the external cause: Secondary | ICD-10-CM

## 2016-11-06 DIAGNOSIS — R531 Weakness: Secondary | ICD-10-CM | POA: Diagnosis not present

## 2016-11-06 DIAGNOSIS — R413 Other amnesia: Secondary | ICD-10-CM

## 2016-11-06 LAB — CBC WITH DIFFERENTIAL/PLATELET
Basophils Absolute: 0 10*3/uL (ref 0.0–0.1)
Basophils Relative: 0 %
Eosinophils Absolute: 0 10*3/uL (ref 0.0–0.7)
Eosinophils Relative: 0 %
HEMATOCRIT: 33.6 % — AB (ref 39.0–52.0)
Hemoglobin: 10.8 g/dL — ABNORMAL LOW (ref 13.0–17.0)
LYMPHS ABS: 0.9 10*3/uL (ref 0.7–4.0)
LYMPHS PCT: 15 %
MCH: 30.4 pg (ref 26.0–34.0)
MCHC: 32.1 g/dL (ref 30.0–36.0)
MCV: 94.6 fL (ref 78.0–100.0)
MONO ABS: 0.9 10*3/uL (ref 0.1–1.0)
MONOS PCT: 15 %
NEUTROS ABS: 4.2 10*3/uL (ref 1.7–7.7)
Neutrophils Relative %: 70 %
Platelets: 155 10*3/uL (ref 150–400)
RBC: 3.55 MIL/uL — ABNORMAL LOW (ref 4.22–5.81)
RDW: 13.2 % (ref 11.5–15.5)
WBC: 6 10*3/uL (ref 4.0–10.5)

## 2016-11-06 LAB — COMPREHENSIVE METABOLIC PANEL
ALBUMIN: 3.8 g/dL (ref 3.5–5.0)
ALK PHOS: 70 U/L (ref 38–126)
ALT: 14 U/L — ABNORMAL LOW (ref 17–63)
ANION GAP: 11 (ref 5–15)
AST: 21 U/L (ref 15–41)
BUN: 25 mg/dL — ABNORMAL HIGH (ref 6–20)
CALCIUM: 9 mg/dL (ref 8.9–10.3)
CO2: 24 mmol/L (ref 22–32)
Chloride: 100 mmol/L — ABNORMAL LOW (ref 101–111)
Creatinine, Ser: 1.42 mg/dL — ABNORMAL HIGH (ref 0.61–1.24)
GFR calc Af Amer: 48 mL/min — ABNORMAL LOW (ref >60)
GFR calc non Af Amer: 41 mL/min — ABNORMAL LOW (ref >60)
GLUCOSE: 107 mg/dL — AB (ref 65–99)
POTASSIUM: 3.6 mmol/L (ref 3.5–5.1)
SODIUM: 135 mmol/L (ref 135–145)
Total Bilirubin: 1.2 mg/dL (ref 0.3–1.2)
Total Protein: 6.1 g/dL — ABNORMAL LOW (ref 6.5–8.1)

## 2016-11-06 LAB — I-STAT CG4 LACTIC ACID, ED: Lactic Acid, Venous: 0.93 mmol/L (ref 0.5–1.9)

## 2016-11-06 LAB — PROTIME-INR
INR: 1.25
Prothrombin Time: 15.8 seconds — ABNORMAL HIGH (ref 11.4–15.2)

## 2016-11-06 MED ORDER — SODIUM CHLORIDE 0.9 % IV SOLN: INTRAVENOUS | Status: DC

## 2016-11-06 MED ORDER — SODIUM CHLORIDE 0.9 % IV BOLUS (SEPSIS)
1000.0000 mL | Freq: Once | INTRAVENOUS | Status: AC
  Administered 2016-11-06: 1000 mL via INTRAVENOUS

## 2016-11-06 NOTE — ED Notes (Signed)
Unable to obtain EKG in 10 mins. Due to monitor located in room not able to shoot EKG. EKG captured at [phone]

## 2016-11-06 NOTE — Consult Note (Signed)
Neurology Consult Note  Reason for Consultation: Family requests neurology consultation  Requesting provider: Carmin Muskrat, MD  CC: None  HPI: This is a 82-yo man with h/o prior embolic stroke in June 8657 in the setting of afib. 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 TEE showed normal EF. He was on warfarin at the time and was subsequently switched to Eliquis. He is followed by Dr. Antony Contras at [facility] and last saw him on 04/05/16. At that time, his daughters reported that he did well with rehab services with improvement in gait and balance. However, he was noted to have some decline in cognition. Dr. Leonie Man documented that he was oriented to place only with impairments in recent and remote memory. No focal neurologic deficits were noted. He was noted to have a stooped posture with gait notable for short steps using a walker. He was maintained on Eliquis with ongoing risk factor modification and instructions to f/u in six months. He apparently has an appointment scheduled for this afternoon but per the ED MD note, "family members request emergency department neurology evaluation for their convenience." Dr. Vanita Panda reports that his daughters want him to be changed to a different medicine for stroke prevention because they think that he has had another stroke today.   The patient was brought to the ED by his daughters after they found him on the floor at his home this morning. They believe that the patient slept in his bed and probably had a fall sometime this morning. The patient stated that he fell yesterday left on the floor of his bedroom overnight. He states that he was standing Was bed when he felt like his legs were going to give out from underneath him. He ended up falling straight down and landing on the bottom. He does not think  that he hit his head. His daughters were able to help him stand and he was able to use his walker. In the ED no focal or acute neurologic deficit was noted but he was felt to have impaired cognition and memory.   He continues to take Eliquis but this was recently held for one day for a dental procedure.   PMH:  Past Medical History:  Diagnosis Date  . Atrial fibrillation (HCC)    coumadin - follow by Reinaldo Meeker at [facility]  . CAD (coronary artery disease)   . History of anterior wall myocardial infarction 09/17/2008  . History of nuclear stress test 06/14/2010   bruce protocol myoview; stress images show perfusion defect in apical wall (scar)  . Hyperlipidemia   . Hypertension   . Stroke [facility])     PSH:  Past Surgical History:  Procedure Laterality Date  . CORONARY ANGIOPLASTY  09/17/2008   r/t acute anterior wall MI; apical LAD lesion  . ESOPHAGOGASTRODUODENOSCOPY N/A 10/29/2012   Procedure: ESOPHAGOGASTRODUODENOSCOPY (EGD);  Surgeon: Arta Silence, MD;  Location: [facility];  Service: Endoscopy;  Laterality: N/A;  . EYE SURGERY Bilateral    cataract  . [place]  2005  . HOT HEMOSTASIS N/A 10/29/2012   Procedure: HOT HEMOSTASIS (ARGON PLASMA COAGULATION/BICAP);  Surgeon: Arta Silence, MD;  Location: [facility];  Service: Endoscopy;  Laterality: N/A;  . PROSTATE SURGERY  2000  . TRANSTHORACIC ECHOCARDIOGRAM  06/29/2010   EF=>55%, normal LV systolic function; LA & RA mod dilated; mild mitral annular  calcif & mild MR; mod TR & elevated RV systolic pressure (mild pulm HTN); AV mildly sclerotic; mild pulm valve regurg    Family history: Family History  Problem Relation Age of Onset  . Stroke Mother   . Aneurysm Father     abdominal  . Emphysema Sister   . Cancer Brother   . Heart disease Brother   . Cancer Sister   . Cancer Maternal Grandmother     Social history:  Social History   Social History  . Marital status: Widowed    Spouse name: N/A  . Number  of children: 2  . Years of education: B.S. [employer]   Occupational History  .  Retired   Social History Main Topics  . Smoking status: Never Smoker  . Smokeless tobacco: Current User    Types: Chew     Comment: occasionally  . Alcohol use No  . Drug use: No  . Sexual activity: Not on file   Other Topics Concern  . Not on file   Social History Narrative  . No narrative on file    Current outpatient meds: Medications reviewed and reconciled.  No outpatient prescriptions have been marked as taking for the 11/06/16 encounter [facility] Encounter).    Current inpatient meds: Medications reviewed and reconciled.  Current Facility-Administered Medications  Medication Dose Route Frequency Provider Last Rate Last Dose  . 0.9 %  sodium chloride infusion   Intravenous Continuous Carmin Muskrat, MD       Current Outpatient Prescriptions  Medication Sig Dispense Refill  . apixaban (ELIQUIS) 2.5 MG TABS tablet Take 1 tablet (2.5 mg total) by mouth 2 (two) times daily. 60 tablet 0  . atorvastatin (LIPITOR) 40 MG tablet Take 40 mg by mouth every other day.    . diphenhydrAMINE (BENADRYL) 25 MG tablet Take 25 mg by mouth every 6 (six) hours as needed for itching or allergies.    . isosorbide mononitrate (IMDUR) 60 MG 24 hr tablet Take 60 mg by mouth daily before breakfast.    . metoprolol (LOPRESSOR) 50 MG tablet Take 75 mg by mouth 2 (two) times daily.    . nitroGLYCERIN (NITROSTAT) 0.4 MG SL tablet Place 1 tablet (0.4 mg total) under the tongue every 5 (five) minutes as needed for chest pain. 25 tablet 6  . ramipril (ALTACE) 10 MG capsule Take 10 mg by mouth daily.    . vitamin B-12 1000 MCG tablet Take 1 tablet (1,000 mcg total) by mouth daily. 30 tablet 0    Allergies: No Known Allergies  ROS: As per HPI. A full 14-point review of systems was performed and is otherwise unremarkable.  PE:  BP 133/87   Pulse 84   Temp 97.5 F (36.4 C) (Oral)   Resp (!) 21   Wt 72.6 kg (160 lb)    SpO2 100%   BMI 22.96 kg/m   General: WDWN elderly Caucasian man lying on ED gurney in no acute distress. He is alert and oriented to self only. Spontaneous recall is 0 of 3 items at 1 minute and he gets one item with recognition cues. Her processing is mildly delayed. Sustained attention is also mildly delayed. He has no significant dysarthria. Speech is somewhat empty but no overt aphasia. Affect is restricted. Comportment is normal. He is somewhat perseverative. HEENT: Normocephalic. Neck supple without LAD. MMM, OP clear. Dentition good. Sclerae anicteric. No conjunctival injection.  CV: Irregularly irregular, no murmur. Carotid pulses full and symmetric, no bruits. Distal pulses 2+  and symmetric.  Lungs: CTAB.  Abdomen: Soft, non-distended, non-tender. Bowel sounds present x4.  Extremities: No ptosis/clubbing. He has mild edema in both feet and lower legs. Neuro:  CN: Pupils are equal and round. They are symmetrically reactive from 2-->1 mm. visual fields are full to confrontation. EOMI without nystagmus. He has some breakup of smooth pursuits in all directions of gaze. No reported diplopia. Facial sensation is intact to light touch. Face is symmetric at rest with normal strength and mobility. Hearing is intact to conversational voice. Palate elevates symmetrically and uvula is midline. Voice is normal in tone, pitch and quality. Bilateral SCM and trapezii are 5/5. Tongue is midline with normal bulk and mobility.  Motor: Normal bulk, tone, and strength with the exception of 4+/5 strength in both hip flexors. He has mitgehen paratonia. No tremor or other abnormal movements. No drift.  Sensation: Intact to light touch.  DTRs: 2+, symmetric with trace ankle jerks bilaterally. Toes downgoing bilaterally. He has a positive glabellar, bilateral grasps, and bilateral palmomentals.  Coordination: Finger-to-nose and heel-to-shin are slow but without dysmetria. Finger taps are slow but symmetric, no  decrement.     Labs:  Lab Results  Component Value Date   WBC 6.0 11/06/2016   HGB 10.8 (L) 11/06/2016   HCT 33.6 (L) 11/06/2016   PLT 155 11/06/2016   GLUCOSE 107 (H) 11/06/2016   CHOL 106 02/17/2016   TRIG 50 02/17/2016   HDL 32 (L) 02/17/2016   LDLCALC 64 02/17/2016   ALT 14 (L) 11/06/2016   AST 21 11/06/2016   NA 135 11/06/2016   K 3.6 11/06/2016   CL 100 (L) 11/06/2016   CREATININE 1.42 (H) 11/06/2016   BUN 25 (H) 11/06/2016   CO2 24 11/06/2016   TSH 1.955 02/15/2016   INR 1.25 11/06/2016   HGBA1C 5.7 (H) 02/17/2016   UA pending UDS pending Lactate 0.93  Imaging:  I have personally and independently reviewed the [facility] without contrast from today. This shows mild to moderate diffuse generalized atrophy. A mild burden of chronic small vessel ischemic disease is noted in the bihemispheric white matter. No obvious acute abnormality.   I have personally and independently reviewed the MRI of the brain without contrast from 02/16/16. This shows numerous small areas of restricted diffusion involving the posterior and anterior circulations bilaterally, consistent with embolic stroke from a proximal source. Mild diffuse generalized atrophy.    Assessment and Plan:  1. Fall: The patient had an unwitnessed fall at home and was found on the floor by his daughter's this morning. It is unclear when he fell as the patient states this happened yesterday but the daughters believe that he fell sometime this morning. No apparent injury was sustained during a fall. He has no lateralizing deficits on exam but does have some mild proximal weakness in both lower extremities. He uses a walker and/or cane for ambulation at baseline. A home health referral may be reasonable to see if he could benefit from further physical therapy to help minimize future fall risk as much as possible.  2. Cerebrovascular disease: This is chronic with no evidence of acute stroke today. He is already optimally medically  managed with Eliquis and this should be continued. Continue with risk factor modification with control of blood pressure, lipids, and glucose as needed.  3. Memory loss: He appears to have significant memory loss and disorientation. His daughter feels like this is fairly static. I suspect he likely has frank dementia at this point and  would consider formal outpatient neuropsychological testing to further characterize. His cognitive impairment could contribute to fall risk. Also places him at risk for encephalopathy and delirium from all causes. Currently, he seems to be at his normal cognitive baseline according to his daughter. Will defer further management to his outpatient neurologist.  4. Abnormality of gait: As noted above, he has some proximal weakness in both legs. He has been using a cane and/or walker since his stroke in June 2017. His daughter feels that his gait has deteriorated further over the past few weeks and they have been encouraging him to use his walker more regularly as a result. I recommended that she discuss a potential home health referral for physical therapy with either his primary care provider or his treating neurologist.   Needs outpatient neurology follow-up?: Yes. His daughter was encouraged to contact his neurologist office to reschedule an appointment since they will not be keeping the appointment that was scheduled for today.  This was discussed with the patient and his daughter. Education was provided on the diagnosis and expected evaluation and treatment. They are in agreement with the plan as noted. They were given the opportunity to ask any questions and these were addressed to their satisfaction.   I have no additional recommendations at this time. The patient is okay for discharge home from the neurology perspective.

## 2016-11-06 NOTE — ED Notes (Signed)
Pt stated he was unable to void at this time. Nurse was notified.

## 2016-11-06 NOTE — ED Triage Notes (Signed)
Pt in from home by self via [facility] after being found down on floor this morning. Per EMS, pt takes Eliquis, denies hitting head or LOC. Per family, pt has hx of CVA 83mo's ago with no residual. Family states they've noticed increasing confusion x few days, they last saw him at 1900 last night. Arrives alert, NAD

## 2016-11-06 NOTE — Discharge Instructions (Signed)
As discussed, your evaluation today has been largely reassuring.  But, it is important that you monitor your condition carefully, and do not hesitate to return to the ED if you develop new, or concerning changes in your condition. ? ?Otherwise, please follow-up with your physician for appropriate ongoing care. ? ?

## 2016-11-06 NOTE — ED Provider Notes (Signed)
[facility] DEPT Provider Note   CSN: [id number] Arrival date & time: 11/06/16  0809     History   Chief Complaint Chief Complaint  Patient presents with  . Fall    found on floor    HPI STCLAIR Craig Richard is a 83 y.o. male.  HPI Patient presents with daughters who assist with the history of present illness. Patient presents via EMS after being found on the floor earlier today. Patient has history of prior strokes, has poor short-term memory, level V caveat. They notes that the patient was found on the floor, after unclear circumstances. They hypothesized that the patient slept in his bed, but today, at some point earlier in the morning, the patient had a fall. Patient self states that he fell yesterday. He denies any ongoing pain. Family states the patient has been in his usual state of health, with a brief, one day pause in his anticoagulant dosing for dental procedure. This was well tolerated, yesterday. Otherwise, no recent medication changes, diet changes, activity changes. They state that today, after finding the patient on the floor, they were able to help him stand, use his walker.  Past Medical History:  Diagnosis Date  . Atrial fibrillation (HCC)    coumadin - follow by Reinaldo Meeker at [facility]  . CAD (coronary artery disease)   . History of anterior wall myocardial infarction 09/17/2008  . History of nuclear stress test 06/14/2010   bruce protocol myoview; stress images show perfusion defect in apical wall (scar)  . Hyperlipidemia   . Hypertension   . Stroke [facility])     Patient Active Problem List   Diagnosis Date Noted  . 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 Acute cerebrovascular accident (CVA) ([place]) 02/17/2016  . Acute encephalopathy   . Altered level of consciousness 02/15/2016  . Delirium 02/15/2016  . CAD in native artery, wth hx ant MI, wth PTCA to LAD, 2010 06/09/2013  . Atrial fibrillation ([place])   . Hyperlipidemia   .  Hypertension     Past Surgical History:  Procedure Laterality Date  . CORONARY ANGIOPLASTY  09/17/2008   r/t acute anterior wall MI; apical LAD lesion  . ESOPHAGOGASTRODUODENOSCOPY N/A 10/29/2012   Procedure: ESOPHAGOGASTRODUODENOSCOPY (EGD);  Surgeon: Arta Silence, MD;  Location: [facility];  Service: Endoscopy;  Laterality: N/A;  . EYE SURGERY Bilateral    cataract  . [place]  2005  . HOT HEMOSTASIS N/A 10/29/2012   Procedure: HOT HEMOSTASIS (ARGON PLASMA COAGULATION/BICAP);  Surgeon: Arta Silence, MD;  Location: [facility];  Service: Endoscopy;  Laterality: N/A;  . PROSTATE SURGERY  2000  . TRANSTHORACIC ECHOCARDIOGRAM  06/29/2010   EF=>55%, normal LV systolic function; LA & RA mod dilated; mild mitral annular calcif & mild MR; mod TR & elevated RV systolic pressure (mild pulm HTN); AV mildly sclerotic; mild pulm valve regurg       Home Medications    Prior to Admission medications   Medication Sig Start Date End Date Taking? Authorizing Provider  apixaban (ELIQUIS) 2.5 MG TABS tablet Take 1 tablet (2.5 mg total) by mouth 2 (two) times daily. 02/19/16   Karmen Bongo, MD  atorvastatin (LIPITOR) 40 MG tablet Take 40 mg by mouth every other day.    Historical Provider, MD  diphenhydrAMINE (BENADRYL) 25 MG tablet Take 25 mg by mouth every 6 (six) hours as needed for itching or allergies.    Historical Provider, MD  isosorbide mononitrate (IMDUR) 60 MG 24 hr tablet Take 60  mg by mouth daily before breakfast.    Historical Provider, MD  metoprolol (LOPRESSOR) 50 MG tablet Take 75 mg by mouth 2 (two) times daily.    Historical Provider, MD  nitroGLYCERIN (NITROSTAT) 0.4 MG SL tablet Place 1 tablet (0.4 mg total) under the tongue every 5 (five) minutes as needed for chest pain. 06/07/14   Isaiah Serge, NP  ramipril (ALTACE) 10 MG capsule Take 10 mg by mouth daily.    Historical Provider, MD  vitamin B-12 1000 MCG tablet Take 1 tablet (1,000 mcg total) by mouth daily.  02/19/16   Karmen Bongo, MD    Family History Family History  Problem Relation Age of Onset  . Stroke Mother   . Aneurysm Father     abdominal  . Emphysema Sister   . Cancer Brother   . Heart disease Brother   . Cancer Sister   . Cancer Maternal Grandmother     Social History Social History  Substance Use Topics  . Smoking status: Never Smoker  . Smokeless tobacco: Current User    Types: Chew     Comment: occasionally  . Alcohol use No     Allergies   Patient has no known allergies.   Review of Systems Review of Systems  Unable to perform ROS: Dementia     Physical Exam Updated Vital Signs Wt 160 lb (72.6 kg)   SpO2 (!) 89%   BMI 22.96 kg/m   Physical Exam  Constitutional: He has a sickly appearance. No distress.  HENT:  Head: Normocephalic and atraumatic.    Eyes: Conjunctivae and EOM are normal.  Neck: No spinous process tenderness and no muscular tenderness present. No neck rigidity. Normal range of motion present.  Cardiovascular: Normal rate and regular rhythm.   Pulmonary/Chest: Effort normal. No stridor. No respiratory distress.  Abdominal: He exhibits no distension.  Musculoskeletal: He exhibits no edema.  Neurological: He is alert. He displays atrophy. No cranial nerve deficit. He exhibits normal muscle tone.  Skin: Skin is warm and dry.  Psychiatric: He is slowed. Cognition and memory are impaired.  Nursing note and vitals reviewed.    ED Treatments / Results  Labs (all labs ordered are listed, but only abnormal results are displayed) Labs Reviewed  COMPREHENSIVE METABOLIC PANEL - Abnormal; Notable for the following:       Result Value   Chloride 100 (*)    Glucose, Bld 107 (*)    BUN 25 (*)    Creatinine, Ser 1.42 (*)    Total Protein 6.1 (*)    ALT 14 (*)    GFR calc non Af Amer 41 (*)    GFR calc Af Amer 48 (*)    All other components within normal limits  CBC WITH DIFFERENTIAL/PLATELET - Abnormal; Notable for the following:      RBC 3.55 (*)    Hemoglobin 10.8 (*)    HCT 33.6 (*)    All other components within normal limits  PROTIME-INR - Abnormal; Notable for the following:    Prothrombin Time 15.8 (*)    All other components within normal limits  URINE CULTURE  URINALYSIS, ROUTINE W REFLEX MICROSCOPIC  I-STAT CG4 LACTIC ACID, ED    EKG  EKG Interpretation  Date/Time:  Tuesday November 06 2016 08:23:41 EDT Ventricular Rate:  86 PR Interval:    QRS Duration: 86 QT Interval:  392 QTC Calculation: 469 R Axis:   -18 Text Interpretation:  Atrial fibrillation Premature ventricular complexes Artifact T wave  abnormality Abnormal ekg Confirmed by Carmin Muskrat  MD [phone]) on 11/06/2016 8:34:32 AM       Radiology Ct Head Wo Contrast  Result Date: 11/06/2016 CLINICAL DATA:  Patient found on floor. EXAM: CT HEAD WITHOUT CONTRAST TECHNIQUE: Contiguous axial images were obtained from the base of the skull through the vertex without intravenous contrast. COMPARISON:  CT scan of February 15, 2016. FINDINGS: Brain: Mild chronic ischemic white matter disease is noted. Mild diffuse cortical atrophy is noted. No mass effect or midline shift is noted. Ventricular size is within normal limits. There is no evidence of mass lesion, hemorrhage or acute infarction. Vascular: No hyperdense vessel or unexpected calcification. Skull: Normal. Negative for fracture or focal lesion. Sinuses/Orbits: No acute finding. Other: None. IMPRESSION: Mild chronic ischemic white matter disease. Mild diffuse cortical atrophy. No acute intracranial abnormality seen. Electronically Signed   By: Marijo Conception, M.D.   On: 11/06/2016 09:08   Dg Chest Port 1 View  Result Date: 11/06/2016 CLINICAL DATA:  Pain following fall.  Shortness of breath. EXAM: PORTABLE CHEST 1 VIEW COMPARISON:  February 15, 2016 and October 23, 2013 FINDINGS: There is focal interstitial fibrosis in the bases, stable. Lungs elsewhere clear. Heart is mildly enlarged with pulmonary  vascularity within normal limits. No adenopathy. There is atherosclerotic calcification in the aorta. No bone lesions. IMPRESSION: Stable bibasilar interstitial fibrosis. No edema or consolidation. Stable cardiac prominence. There is aortic atherosclerosis. Electronically Signed   By: Lowella Grip III M.D.   On: 11/06/2016 09:12    Procedures Procedures (including critical care time)  Medications Ordered in ED Medications  sodium chloride 0.9 % bolus 1,000 mL (not administered)    And  0.9 %  sodium chloride infusion (not administered)     Initial Impression / Assessment and Plan / ED Course  I have reviewed the triage vital signs and the nursing notes.  Pertinent labs & imaging results that were available during my care of the patient were reviewed by me and considered in my medical decision making (see chart for details).  9:45 AM I discussed all findings the patient's family members. They are aware of his reassuring CT scan. We had a lengthy conversation about the patient's baseline functionality, history of prior strokes, current use of appropriate medication for stroke risk reduction. We discussed the patient's scheduled neurology follow-up appointment for this afternoon. Family members request emergency department neurology evaluation for their convenience.  This elderly male with baseline dementia, prior strokes presents from home after likely fall. Here the patient is in no distress, is hemodynamically stable, labs are consistent with prior studies. No CT evidence for hemorrhage. No clear etiology for the fall, but given the patient's poor cognitive functioning, at baseline, as well as absence of distress, pleasant and interactive status currently, reassuring vital signs, there is low suspicion for new occult hemorrhage, and though the patient may have had acute stroke, he is on appropriate medications for further risk reduction. Per family request, I discussed his case  with our neurology colleagues, who will evaluate the patient as well.  Update: Patient has been seen by neurology. He remains in similar condition. I discussed patient's medications, status with his family members again, and he was discharged in stable condition.   Final Clinical Impressions(s) / ED Diagnoses  Fall, initial encounter    Carmin Muskrat, MD 11/06/16 [phone]

## 2016-11-06 NOTE — ED Notes (Signed)
Family states patient has not taken morning dose of Eliquis today.

## 2016-11-07 ENCOUNTER — Encounter: Payer: Self-pay | Admitting: Nurse Practitioner

## 2016-11-08 DIAGNOSIS — R55 Syncope and collapse: Secondary | ICD-10-CM | POA: Diagnosis not present

## 2016-11-08 DIAGNOSIS — E118 Type 2 diabetes mellitus with unspecified complications: Secondary | ICD-10-CM | POA: Diagnosis not present

## 2016-11-08 DIAGNOSIS — I6310 Cerebral infarction due to embolism of unspecified precerebral artery: Secondary | ICD-10-CM | POA: Diagnosis not present

## 2016-11-08 DIAGNOSIS — R531 Weakness: Secondary | ICD-10-CM | POA: Diagnosis not present

## 2016-11-14 ENCOUNTER — Encounter: Payer: Self-pay | Admitting: Neurology

## 2016-11-14 ENCOUNTER — Ambulatory Visit (INDEPENDENT_AMBULATORY_CARE_PROVIDER_SITE_OTHER): Payer: Medicare Other | Admitting: Neurology

## 2016-11-14 VITALS — BP 115/72 | HR 65 | Ht 70.0 in | Wt 164.0 lb

## 2016-11-14 DIAGNOSIS — R55 Syncope and collapse: Secondary | ICD-10-CM | POA: Diagnosis not present

## 2016-11-14 NOTE — Patient Instructions (Signed)
I had a long discussion with the patient and her daughter regarding his recent episode of unresponsiveness followed by confusion and lethargy being of unclear etiology It certainly possible he could've had a small stroke since he was off eliquis for 2 days for the dental procedure but unwitnessed seizure or syncopal event is also possible. I recommend he continue eliquis and agree with referral to physical therapy for gait and balance training. Check EEG for seizure activity. I do not believe further neurovascular testing is indicated at the present time. He'll return for follow-up in the future as necessary.

## 2016-11-14 NOTE — Progress Notes (Signed)
[facility] [address] [place]. [place] [address] [phone]       OFFICE FOLLOW-UP NOTE  Mr. Craig Richard Date of Birth:  Feb 11, 1922 Medical Record Number:  [id number]   HPI: Office visit 04/05/2016 ; Mr Craig Richard is a pleasant elderly Caucasian male seen today for first office follow-up visit following hospital admission for stroke in June 2017. He is accompanied today by his 2 daughters. Craig Richard is an 83 y.o. male with a history of atrial fibrillation on anticoagulation, hypertension, hyperlipidemia and coronary artery disease, admitted to [facility] on 02/15/2016 for acute altered mental status with confusion. Patient reportedly is usually of sound mind and independent and able to care for himself. CT scan on 02/15/2016 was unremarkable for acute changes. MRI on 02/16/2016 showed multiple areas of acute infarction involving cerebellum as well as right and left occipital regions. Patient has not experienced visual changes nor vertigo or nausea. He is on Coumadin and his INR on admission was 1.88. MRI on 02/16/2016 was 2.20. He had an EEG on 02/16/2016 which was normal. NIH stroke score at the time of this evaluation was 1. He was LKW 02/14/2016. Patient was not administered IV t-PA secondary to unclear exactly when he was last known well; no clear focal deficit. He was admitted for further evaluation and treatment. 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 Carotid ultrasound showed no significant extra cholesterolosis but right subclavian artery waveform is abnormal suggestive of distal stenosis or occlusion. Echo currently gram showed normal ejection fraction. EEG was normal. LDL cholesterol was 64 mg percent and and hemoglobin A1c was 5.7. Patient was switched from warfarin to  eliquis for stroke prevention prior to fibrillation. He will discharge him and states his done well. He has finished physical and occupation therapy as an outpatient. His balance and walking has improved. He is using a walker all the time. The daughters feel that the patient has lost some orientation of time but overall cognitively seems to bring on right. History living at home. He has a home aide now comes for a few hours every day. He has life alert button. He has a DO NOT RESUSCITATE on file. He is tolerating eliquis well without major bleeding but does get easy bruising. He needs help with some activities but does mostly independent. Update 11/14/2016 : He returns for follow-up after last visit 6 months ago. He is accompanied by her daughter provides most of the history. The patient's daughter found him lying on the floor in the morning confused and not remembering what happened last Tuesday. He was taken to [facility] emergency room on 11/06/16 and was seen by Dr. Shon Hale neuro hospitalists. Patient had been off eliquis for 2 days for a dental procedure which he had had the day before. CT scan did not show any acute stroke. Patient had no focal deficits but appeared to be tired, sleepy and confused. The daughter states that his shown gradual improvement over the last several days and is less sleepy and more alert. He seems to have generalized weakness and has been referred for physical therapy by primary physician. She is also noted some increasing tremors in his hands as well. There have been no trouble with speech or extremity weakness vision. He has somewhat similar episode last summer when he was found to be unresponsive  and remained confused for edema 7 made gradual improvement. He has no known history of seizures, significant head injury. Has no history of syncopal events. ROS:   14 system review of systems is positive for   activity change, fatigue, runny nose, ankle swelling, easy bruising, memory  loss, numbness, weakness, tremors, right leg pain, aching muscles, back pain, walking difficulty, confusion, sleep talking and all other systems negative PMH:  Past Medical History:  Diagnosis Date  . Atrial fibrillation (HCC)    coumadin - follow by Reinaldo Meeker at [facility]  . CAD (coronary artery disease)   . History of anterior wall myocardial infarction 09/17/2008  . History of nuclear stress test 06/14/2010   bruce protocol myoview; stress images show perfusion defect in apical wall (scar)  . Hyperlipidemia   . Hypertension   . Stroke [facility])     Social History:  Social History   Social History  . Marital status: Widowed    Spouse name: N/A  . Number of children: 2  . Years of education: B.S. [employer]   Occupational History  .  Retired   Social History Main Topics  . Smoking status: Never Smoker  . Smokeless tobacco: Current User    Types: Chew     Comment: occasionally  . Alcohol use No  . Drug use: No  . Sexual activity: Not on file   Other Topics Concern  . Not on file   Social History Narrative  . No narrative on file    Medications:   Current Outpatient Prescriptions on File Prior to Visit  Medication Sig Dispense Refill  . apixaban (ELIQUIS) 2.5 MG TABS tablet Take 1 tablet (2.5 mg total) by mouth 2 (two) times daily. 60 tablet 0  . atorvastatin (LIPITOR) 40 MG tablet Take 40 mg by mouth every other day.    . isosorbide mononitrate (IMDUR) 60 MG 24 hr tablet Take 60 mg by mouth daily before breakfast.    . metoprolol (LOPRESSOR) 50 MG tablet Take 75 mg by mouth 2 (two) times daily.    . nitroGLYCERIN (NITROSTAT) 0.4 MG SL tablet Place 1 tablet (0.4 mg total) under the tongue every 5 (five) minutes as needed for chest pain. 25 tablet 6  . ramipril (ALTACE) 10 MG capsule Take 10 mg by mouth daily.    . vitamin B-12 1000 MCG tablet Take 1 tablet (1,000 mcg total) by mouth daily. 30 tablet 0   No current facility-administered medications on file prior to visit.      Allergies:  No Known Allergies  Physical Exam General: Frail petite elderly Caucasian male, seated, in no evident distress Head: head normocephalic and atraumatic.  Neck: supple with no carotid or supraclavicular bruits Cardiovascular: regular rate and rhythm, no murmurs Musculoskeletal: no deformity Skin:  no rash/petichiae Vascular:  Normal pulses all extremities Vitals:   11/14/16 1357  BP: 115/72  Pulse: 65   Neurologic Exam Mental Status: Awake and fully alert. Oriented to place only. Recent and remote memory diminished. Attention span, concentration and fund of knowledge poor. Mood and affect appropriate.  Cranial Nerves: Fundoscopic exam reveals sharp disc margins. Pupils equal, briskly reactive to light. Extraocular movements full without nystagmus. Visual fields full to confrontation. Hearing diminished slightly bilaterally.. Facial sensation intact. Face, tongue, palate moves normally and symmetrically.  Motor: Normal bulk and tone. Normal strength in all tested extremity muscles. Sensory.: intact to touch ,pinprick .position and vibratory sensation.  Coordination: Rapid alternating movements normal in all extremities. Finger-to-nose and  heel-to-shin performed accurately bilaterally. Gait and Station: Arises from chair with slight difficulty. Stance is stooped. Gait demonstrates short steps and needs 1 person assist . Not able  to heel, toe and tandem walk without difficulty.  Reflexes: 1+ and symmetric. Toes downgoing.   NIHSS  2 Modified Rankin  3  ASSESSMENT: 40 year Pleasant Caucasian male with by lateral posterior circulation embolic infarcts in June 2017 from atrial fibrillation was doing quite well despite his age and multiple strokes. Recent episode of unresponsiveness followed by confusion and tiredness of unclear etiology. Possible syncopal episode versus unwitnessed seizure or small infarct not visualized on CT scan    PLAN: I had a long discussion with the  patient and her daughter regarding his recent episode of unresponsiveness followed by confusion and lethargy being of unclear etiology It certainly possible he could've had a small stroke since he was off eliquis for 2 days for the dental procedure but unwitnessed seizure or syncopal event is also possible. I recommend he continue eliquis and agree with referral to physical therapy for gait and balance training. Check EEG for seizure activity. I do not believe further neurovascular testing is indicated at the present time. He'll return for follow-up in the future as necessary. Greater than 50% of time during this 25 minute visit was spent on counseling,explanation of diagnosis, planning of further management, discussion with patient and family and coordination of care Antony Contras, MD  [facility] [address] [place], [place] [address]  Phone [phone] Fax [phone] Note: This document was prepared with digital dictation and possible smart phrase technology. Any transcriptional errors that result from this process are unintentional

## 2016-11-17 DIAGNOSIS — Z8673 Personal history of transient ischemic attack (TIA), and cerebral infarction without residual deficits: Secondary | ICD-10-CM | POA: Diagnosis not present

## 2016-11-17 DIAGNOSIS — I10 Essential (primary) hypertension: Secondary | ICD-10-CM | POA: Diagnosis not present

## 2016-11-17 DIAGNOSIS — Z7901 Long term (current) use of anticoagulants: Secondary | ICD-10-CM | POA: Diagnosis not present

## 2016-11-17 DIAGNOSIS — I482 Chronic atrial fibrillation: Secondary | ICD-10-CM | POA: Diagnosis not present

## 2016-11-17 DIAGNOSIS — I2510 Atherosclerotic heart disease of native coronary artery without angina pectoris: Secondary | ICD-10-CM | POA: Diagnosis not present

## 2016-11-17 DIAGNOSIS — E119 Type 2 diabetes mellitus without complications: Secondary | ICD-10-CM | POA: Diagnosis not present

## 2016-11-17 DIAGNOSIS — E782 Mixed hyperlipidemia: Secondary | ICD-10-CM | POA: Diagnosis not present

## 2016-11-20 DIAGNOSIS — E119 Type 2 diabetes mellitus without complications: Secondary | ICD-10-CM | POA: Diagnosis not present

## 2016-11-20 DIAGNOSIS — I482 Chronic atrial fibrillation: Secondary | ICD-10-CM | POA: Diagnosis not present

## 2016-11-20 DIAGNOSIS — Z8673 Personal history of transient ischemic attack (TIA), and cerebral infarction without residual deficits: Secondary | ICD-10-CM | POA: Diagnosis not present

## 2016-11-20 DIAGNOSIS — I10 Essential (primary) hypertension: Secondary | ICD-10-CM | POA: Diagnosis not present

## 2016-11-20 DIAGNOSIS — E782 Mixed hyperlipidemia: Secondary | ICD-10-CM | POA: Diagnosis not present

## 2016-11-20 DIAGNOSIS — I2510 Atherosclerotic heart disease of native coronary artery without angina pectoris: Secondary | ICD-10-CM | POA: Diagnosis not present

## 2016-11-21 ENCOUNTER — Ambulatory Visit (INDEPENDENT_AMBULATORY_CARE_PROVIDER_SITE_OTHER): Payer: Medicare Other

## 2016-11-21 DIAGNOSIS — R55 Syncope and collapse: Secondary | ICD-10-CM

## 2016-11-23 DIAGNOSIS — I2510 Atherosclerotic heart disease of native coronary artery without angina pectoris: Secondary | ICD-10-CM | POA: Diagnosis not present

## 2016-11-23 DIAGNOSIS — I482 Chronic atrial fibrillation: Secondary | ICD-10-CM | POA: Diagnosis not present

## 2016-11-23 DIAGNOSIS — I10 Essential (primary) hypertension: Secondary | ICD-10-CM | POA: Diagnosis not present

## 2016-11-23 DIAGNOSIS — E119 Type 2 diabetes mellitus without complications: Secondary | ICD-10-CM | POA: Diagnosis not present

## 2016-11-23 DIAGNOSIS — Z8673 Personal history of transient ischemic attack (TIA), and cerebral infarction without residual deficits: Secondary | ICD-10-CM | POA: Diagnosis not present

## 2016-11-23 DIAGNOSIS — E782 Mixed hyperlipidemia: Secondary | ICD-10-CM | POA: Diagnosis not present

## 2016-11-25 ENCOUNTER — Observation Stay (HOSPITAL_COMMUNITY): Payer: Medicare Other

## 2016-11-25 ENCOUNTER — Encounter (HOSPITAL_COMMUNITY): Payer: Self-pay | Admitting: Emergency Medicine

## 2016-11-25 ENCOUNTER — Inpatient Hospital Stay (HOSPITAL_COMMUNITY)
Admission: EM | Admit: 2016-11-25 | Discharge: 2016-12-18 | DRG: 987 | Disposition: E | Payer: Medicare Other | Attending: Family Medicine | Admitting: Family Medicine

## 2016-11-25 ENCOUNTER — Inpatient Hospital Stay (HOSPITAL_COMMUNITY): Payer: Medicare Other

## 2016-11-25 ENCOUNTER — Emergency Department (HOSPITAL_COMMUNITY): Payer: Medicare Other

## 2016-11-25 DIAGNOSIS — R40243 Glasgow coma scale score 3-8, unspecified time: Secondary | ICD-10-CM | POA: Diagnosis not present

## 2016-11-25 DIAGNOSIS — I6789 Other cerebrovascular disease: Secondary | ICD-10-CM | POA: Diagnosis present

## 2016-11-25 DIAGNOSIS — I2510 Atherosclerotic heart disease of native coronary artery without angina pectoris: Secondary | ICD-10-CM | POA: Diagnosis present

## 2016-11-25 DIAGNOSIS — R569 Unspecified convulsions: Secondary | ICD-10-CM | POA: Diagnosis present

## 2016-11-25 DIAGNOSIS — Z7901 Long term (current) use of anticoagulants: Secondary | ICD-10-CM

## 2016-11-25 DIAGNOSIS — Y92238 Other place in hospital as the place of occurrence of the external cause: Secondary | ICD-10-CM | POA: Diagnosis present

## 2016-11-25 DIAGNOSIS — R402213 Coma scale, best verbal response, none, at hospital admission: Secondary | ICD-10-CM | POA: Diagnosis present

## 2016-11-25 DIAGNOSIS — I639 Cerebral infarction, unspecified: Secondary | ICD-10-CM | POA: Diagnosis present

## 2016-11-25 DIAGNOSIS — S065X0A Traumatic subdural hemorrhage without loss of consciousness, initial encounter: Principal | ICD-10-CM | POA: Diagnosis present

## 2016-11-25 DIAGNOSIS — R402313 Coma scale, best motor response, none, at hospital admission: Secondary | ICD-10-CM | POA: Diagnosis present

## 2016-11-25 DIAGNOSIS — S025XXA Fracture of tooth (traumatic), initial encounter for closed fracture: Secondary | ICD-10-CM | POA: Diagnosis present

## 2016-11-25 DIAGNOSIS — Z823 Family history of stroke: Secondary | ICD-10-CM

## 2016-11-25 DIAGNOSIS — I252 Old myocardial infarction: Secondary | ICD-10-CM

## 2016-11-25 DIAGNOSIS — R402113 Coma scale, eyes open, never, at hospital admission: Secondary | ICD-10-CM | POA: Diagnosis present

## 2016-11-25 DIAGNOSIS — E785 Hyperlipidemia, unspecified: Secondary | ICD-10-CM | POA: Diagnosis present

## 2016-11-25 DIAGNOSIS — Y842 Radiological procedure and radiotherapy as the cause of abnormal reaction of the patient, or of later complication, without mention of misadventure at the time of the procedure: Secondary | ICD-10-CM | POA: Diagnosis present

## 2016-11-25 DIAGNOSIS — I10 Essential (primary) hypertension: Secondary | ICD-10-CM | POA: Diagnosis present

## 2016-11-25 DIAGNOSIS — R404 Transient alteration of awareness: Secondary | ICD-10-CM | POA: Diagnosis not present

## 2016-11-25 DIAGNOSIS — S06360A Traumatic hemorrhage of cerebrum, unspecified, without loss of consciousness, initial encounter: Secondary | ICD-10-CM | POA: Diagnosis present

## 2016-11-25 DIAGNOSIS — M6281 Muscle weakness (generalized): Secondary | ICD-10-CM | POA: Diagnosis not present

## 2016-11-25 DIAGNOSIS — Z8673 Personal history of transient ischemic attack (TIA), and cerebral infarction without residual deficits: Secondary | ICD-10-CM

## 2016-11-25 DIAGNOSIS — Z515 Encounter for palliative care: Secondary | ICD-10-CM | POA: Diagnosis present

## 2016-11-25 DIAGNOSIS — G935 Compression of brain: Secondary | ICD-10-CM | POA: Diagnosis present

## 2016-11-25 DIAGNOSIS — R531 Weakness: Secondary | ICD-10-CM | POA: Diagnosis not present

## 2016-11-25 DIAGNOSIS — T45515A Adverse effect of anticoagulants, initial encounter: Secondary | ICD-10-CM | POA: Diagnosis present

## 2016-11-25 DIAGNOSIS — S01511A Laceration without foreign body of lip, initial encounter: Secondary | ICD-10-CM | POA: Diagnosis present

## 2016-11-25 DIAGNOSIS — Z66 Do not resuscitate: Secondary | ICD-10-CM | POA: Diagnosis present

## 2016-11-25 DIAGNOSIS — S066X0A Traumatic subarachnoid hemorrhage without loss of consciousness, initial encounter: Secondary | ICD-10-CM | POA: Diagnosis present

## 2016-11-25 DIAGNOSIS — F17220 Nicotine dependence, chewing tobacco, uncomplicated: Secondary | ICD-10-CM | POA: Diagnosis present

## 2016-11-25 DIAGNOSIS — I69298 Other sequelae of other nontraumatic intracranial hemorrhage: Secondary | ICD-10-CM | POA: Diagnosis not present

## 2016-11-25 DIAGNOSIS — Z9861 Coronary angioplasty status: Secondary | ICD-10-CM

## 2016-11-25 DIAGNOSIS — I4891 Unspecified atrial fibrillation: Secondary | ICD-10-CM | POA: Diagnosis present

## 2016-11-25 DIAGNOSIS — S0990XA Unspecified injury of head, initial encounter: Secondary | ICD-10-CM

## 2016-11-25 DIAGNOSIS — Z8249 Family history of ischemic heart disease and other diseases of the circulatory system: Secondary | ICD-10-CM

## 2016-11-25 DIAGNOSIS — I6201 Nontraumatic acute subdural hemorrhage: Secondary | ICD-10-CM | POA: Diagnosis not present

## 2016-11-25 DIAGNOSIS — R29898 Other symptoms and signs involving the musculoskeletal system: Secondary | ICD-10-CM

## 2016-11-25 DIAGNOSIS — G459 Transient cerebral ischemic attack, unspecified: Secondary | ICD-10-CM | POA: Diagnosis present

## 2016-11-25 DIAGNOSIS — I629 Nontraumatic intracranial hemorrhage, unspecified: Secondary | ICD-10-CM | POA: Diagnosis not present

## 2016-11-25 LAB — I-STAT CHEM 8, ED
BUN: 17 mg/dL (ref 6–20)
Calcium, Ion: 1.11 mmol/L — ABNORMAL LOW (ref 1.15–1.40)
Chloride: 104 mmol/L (ref 101–111)
Creatinine, Ser: 1 mg/dL (ref 0.61–1.24)
Glucose, Bld: 116 mg/dL — ABNORMAL HIGH (ref 65–99)
HCT: 33 % — ABNORMAL LOW (ref 39.0–52.0)
HEMOGLOBIN: 11.2 g/dL — AB (ref 13.0–17.0)
POTASSIUM: 3.7 mmol/L (ref 3.5–5.1)
SODIUM: 136 mmol/L (ref 135–145)
TCO2: 23 mmol/L (ref 0–100)

## 2016-11-25 LAB — RAPID URINE DRUG SCREEN, HOSP PERFORMED
Amphetamines: NOT DETECTED
Barbiturates: NOT DETECTED
Benzodiazepines: NOT DETECTED
COCAINE: NOT DETECTED
OPIATES: NOT DETECTED
TETRAHYDROCANNABINOL: NOT DETECTED

## 2016-11-25 LAB — I-STAT TROPONIN, ED: TROPONIN I, POC: 0.02 ng/mL (ref 0.00–0.08)

## 2016-11-25 LAB — DIFFERENTIAL
BASOS ABS: 0 10*3/uL (ref 0.0–0.1)
Basophils Relative: 0 %
EOS PCT: 1 %
Eosinophils Absolute: 0.1 10*3/uL (ref 0.0–0.7)
LYMPHS ABS: 1.2 10*3/uL (ref 0.7–4.0)
Lymphocytes Relative: 22 %
MONO ABS: 0.8 10*3/uL (ref 0.1–1.0)
Monocytes Relative: 15 %
NEUTROS ABS: 3.3 10*3/uL (ref 1.7–7.7)
NEUTROS PCT: 62 %

## 2016-11-25 LAB — URINALYSIS, ROUTINE W REFLEX MICROSCOPIC
Bilirubin Urine: NEGATIVE
GLUCOSE, UA: NEGATIVE mg/dL
Hgb urine dipstick: NEGATIVE
Ketones, ur: NEGATIVE mg/dL
Leukocytes, UA: NEGATIVE
NITRITE: NEGATIVE
Protein, ur: NEGATIVE mg/dL
SPECIFIC GRAVITY, URINE: 1.013 (ref 1.005–1.030)
pH: 6 (ref 5.0–8.0)

## 2016-11-25 LAB — ETHANOL

## 2016-11-25 LAB — CBC
HEMATOCRIT: 33.6 % — AB (ref 39.0–52.0)
Hemoglobin: 10.9 g/dL — ABNORMAL LOW (ref 13.0–17.0)
MCH: 30.5 pg (ref 26.0–34.0)
MCHC: 32.4 g/dL (ref 30.0–36.0)
MCV: 94.1 fL (ref 78.0–100.0)
Platelets: 171 10*3/uL (ref 150–400)
RBC: 3.57 MIL/uL — ABNORMAL LOW (ref 4.22–5.81)
RDW: 13.2 % (ref 11.5–15.5)
WBC: 5.4 10*3/uL (ref 4.0–10.5)

## 2016-11-25 LAB — COMPREHENSIVE METABOLIC PANEL
ALBUMIN: 3.3 g/dL — AB (ref 3.5–5.0)
ALT: 13 U/L — ABNORMAL LOW (ref 17–63)
ANION GAP: 10 (ref 5–15)
AST: 17 U/L (ref 15–41)
Alkaline Phosphatase: 81 U/L (ref 38–126)
BILIRUBIN TOTAL: 0.7 mg/dL (ref 0.3–1.2)
BUN: 16 mg/dL (ref 6–20)
CALCIUM: 8.6 mg/dL — AB (ref 8.9–10.3)
CO2: 22 mmol/L (ref 22–32)
Chloride: 103 mmol/L (ref 101–111)
Creatinine, Ser: 1.05 mg/dL (ref 0.61–1.24)
GFR calc non Af Amer: 59 mL/min — ABNORMAL LOW (ref >60)
GLUCOSE: 109 mg/dL — AB (ref 65–99)
POTASSIUM: 3.7 mmol/L (ref 3.5–5.1)
Sodium: 135 mmol/L (ref 135–145)
TOTAL PROTEIN: 6 g/dL — AB (ref 6.5–8.1)

## 2016-11-25 LAB — PROTIME-INR
INR: 1.13
Prothrombin Time: 14.5 seconds (ref 11.4–15.2)

## 2016-11-25 LAB — CBG MONITORING, ED: Glucose-Capillary: 123 mg/dL — ABNORMAL HIGH (ref 65–99)

## 2016-11-25 LAB — APTT: aPTT: 30 seconds (ref 24–36)

## 2016-11-25 MED ORDER — NITROGLYCERIN 0.4 MG SL SUBL: 0.4000 mg | SUBLINGUAL_TABLET | SUBLINGUAL | Status: DC | PRN

## 2016-11-25 MED ORDER — SODIUM CHLORIDE 0.9 % IV SOLN: INTRAVENOUS | Status: DC

## 2016-11-25 MED ORDER — LORAZEPAM 2 MG/ML IJ SOLN
INTRAMUSCULAR | Status: AC
  Administered 2016-11-25: 0.5 mg via INTRAVENOUS
  Filled 2016-11-25: qty 1

## 2016-11-25 MED ORDER — RAMIPRIL 10 MG PO CAPS: 10.0000 mg | ORAL_CAPSULE | Freq: Every day | ORAL | Status: DC

## 2016-11-25 MED ORDER — MORPHINE SULFATE (PF) 4 MG/ML IV SOLN
2.0000 mg | INTRAVENOUS | Status: DC | PRN
  Administered 2016-11-25 (×2): 2 mg via INTRAVENOUS
  Filled 2016-11-25 (×2): qty 1

## 2016-11-25 MED ORDER — ACETAMINOPHEN 650 MG RE SUPP: 650.0000 mg | Freq: Four times a day (QID) | RECTAL | Status: DC | PRN

## 2016-11-25 MED ORDER — METOPROLOL TARTRATE 25 MG PO TABS: 75.0000 mg | ORAL_TABLET | Freq: Two times a day (BID) | ORAL | Status: DC

## 2016-11-25 MED ORDER — SODIUM CHLORIDE 0.9% FLUSH: 3.0000 mL | Freq: Two times a day (BID) | INTRAVENOUS | Status: DC

## 2016-11-25 MED ORDER — HYDROCODONE-ACETAMINOPHEN 5-325 MG PO TABS: 1.0000 | ORAL_TABLET | ORAL | Status: DC | PRN

## 2016-11-25 MED ORDER — LORAZEPAM BOLUS VIA INFUSION
1.0000 mg | INTRAVENOUS | Status: DC | PRN
  Filled 2016-11-25: qty 1

## 2016-11-25 MED ORDER — POLYETHYLENE GLYCOL 3350 17 G PO PACK: 17.0000 g | PACK | Freq: Every day | ORAL | Status: DC | PRN

## 2016-11-25 MED ORDER — APIXABAN 2.5 MG PO TABS: 2.5000 mg | ORAL_TABLET | Freq: Two times a day (BID) | ORAL | Status: DC

## 2016-11-25 MED ORDER — ONDANSETRON HCL 4 MG/2ML IJ SOLN: 4.0000 mg | Freq: Four times a day (QID) | INTRAMUSCULAR | Status: DC | PRN

## 2016-11-25 MED ORDER — ATORVASTATIN CALCIUM 40 MG PO TABS: 40.0000 mg | ORAL_TABLET | ORAL | Status: DC

## 2016-11-25 MED ORDER — LORAZEPAM 2 MG/ML IJ SOLN
0.5000 mg | Freq: Once | INTRAMUSCULAR | Status: AC
  Administered 2016-11-25: 0.5 mg via INTRAVENOUS

## 2016-11-25 MED ORDER — ACETAMINOPHEN 325 MG PO TABS: 650.0000 mg | ORAL_TABLET | Freq: Four times a day (QID) | ORAL | Status: DC | PRN

## 2016-11-25 MED ORDER — ISOSORBIDE MONONITRATE ER 30 MG PO TB24: 60.0000 mg | ORAL_TABLET | Freq: Every day | ORAL | Status: DC

## 2016-11-25 MED ORDER — VITAMIN B-12 1000 MCG PO TABS: 1000.0000 ug | ORAL_TABLET | Freq: Every day | ORAL | Status: DC

## 2016-11-25 MED ORDER — TRAZODONE HCL 50 MG PO TABS: 25.0000 mg | ORAL_TABLET | Freq: Every evening | ORAL | Status: DC | PRN

## 2016-11-25 MED ORDER — STROKE: EARLY STAGES OF RECOVERY BOOK
Freq: Once | Status: DC
  Filled 2016-11-25: qty 1

## 2016-11-25 MED ORDER — ONDANSETRON HCL 4 MG PO TABS: 4.0000 mg | ORAL_TABLET | Freq: Four times a day (QID) | ORAL | Status: DC | PRN

## 2016-11-25 MED ORDER — SODIUM CHLORIDE 0.9 % IV SOLN
500.0000 mg | Freq: Two times a day (BID) | INTRAVENOUS | Status: DC
  Administered 2016-11-25: 500 mg via INTRAVENOUS
  Filled 2016-11-25: qty 5

## 2016-12-05 ENCOUNTER — Ambulatory Visit: Payer: Medicare Other | Admitting: Cardiovascular Disease

## 2016-12-18 NOTE — ED Notes (Signed)
Received call from MRI reporting that they believed the pt had a seizure. Upon arrival to MRI pt noted to be unresponsive with eyes opened. Admitting Dr paged to inform of same. Per MRI staff, pt was awake alert prior to going into MRI. While pt was in MRI tech noted that pt was shaking his legs but when she asked him to stop, pt stopped shaking his legs. Pt has laceration to right top lip and right side of head. Pt taken back to room Dr Jeanell Sparrow notified and admitting Dr came into room to evaluate pt.

## 2016-12-18 NOTE — Death Summary Note (Addendum)
DEATH SUMMARY   Patient Details  Name: Craig Richard MRN: [id number] DOB: 1921-11-28  Admission/Discharge Information   Admit Date:  19-Dec-2016  Date of Death: Date of Death: 12/19/16  Time of Death: Dec 10, 1708  Length of Stay: 0  Referring Physician: [name], MD   Reason(s) for Hospitalization  TIA, Seizure, head trauma leading to Subdural hematoma  Diagnoses  Preliminary cause of death:  Secondary Diagnoses (including complications and co-morbidities):  Principal Problem:   TIA (transient ischemic attack) Active Problems:   CAD in native artery, wth hx ant MI, wth PTCA to LAD, 10-Dec-2008   Atrial fibrillation ([place])   Hyperlipidemia   Hypertension   History of stroke   Stroke [facility])   [facility] (including significant findings, care, treatment, and services provided and events leading to death)  Craig Richard is a 83 y.o. year old male who Presents to the hospital with right leg weakness and was felt to have an stroke. The patient underwent a MRI. While obtaining the MRI, the patient had a seizure leading to head trauma. As result of the head trauma, the patient had a subdural hematoma causing midline shift and uncal herniation. Due to the patient's age and ischemic changes of the brainstem, the patient was felt not be a surgical candidate. Additionally, the patient's family declined further intervention. The patient was admitted for palliative care and comfort measures. The patient passed away at 1710 on day of admission.   Pertinent Labs and Studies  Significant Diagnostic Studies Ct Head Wo Contrast  Result Date: 12-19-2016 CLINICAL DATA:  Seizure. EXAM: CT HEAD WITHOUT CONTRAST CT MAXILLOFACIAL WITHOUT CONTRAST TECHNIQUE: Multidetector CT imaging of the head and maxillofacial structures were performed using the standard protocol without intravenous contrast. Multiplanar CT image reconstructions of the maxillofacial structures were also generated.  COMPARISON:  Brain MRI and head CT from earlier today FINDINGS: CT HEAD FINDINGS Brain: Massive mixed density subdural hematoma along the left cerebral convexity which measures up to 31 mm in thickness. There is severe mass effect on the left cerebral hemisphere with subfalcine and left uncal herniation. The right lateral ventricle is entrapped and dilated. Midline shift measures 24 mm. Uncal herniation causes severe distortion of the upper brain stem. Scattered subarachnoid hemorrhage, mainly low perisylvian and interhemispheric. Intraventricular hemorrhage in the lateral ventricles. Possible ACA distribution ischemia on the left due to herniation. Small remote infarcts as previously described. Vascular: Atherosclerotic calcification. Skull: Negative for skull fracture. There is swelling along the right temporal scalp. CT MAXILLOFACIAL FINDINGS Osseous: 100% displacement of left condylar neck fracture. The left condyle is anteriorly subluxed. No additional fracture noted. Orbits: Bilateral cataract resection.  No posttraumatic finding. Sinuses: Negative for hemosinus. Soft tissues: Soft tissue contusion over the right chin. Possible swelling of the nasal bridge. Right-sided lip laceration Critical Value/emergent results were called by telephone at the time of interpretation on 12-19-16 at 12:47 pm to Dr. Jeanell Sparrow , who verbally acknowledged these results. IMPRESSION: 1. Massive acute subdural hematoma along the left cerebral convexity with subfalcine and left uncal herniation compressing the midbrain. Midline shift measures 24 mm and right lateral ventricle is entrapped. 2. Scattered acute subarachnoid hemorrhage. Acute hemorrhage in the lateral ventricles. 3. Left mandibular condyle neck fracture, displaced. 4. Right temporal scalp and chin contusion.  Lip laceration. Electronically Signed   By: Monte Fantasia M.D.   On: 2016-12-19 13:05   Ct Head Wo Contrast  Result Date: 19-Dec-2016 CLINICAL DATA:  Awoke with  right-sided weakness. EXAM: CT  HEAD WITHOUT CONTRAST TECHNIQUE: Contiguous axial images were obtained from the base of the skull through the vertex without intravenous contrast. COMPARISON:  11/06/2016 FINDINGS: Brain: No evidence of acute infarction, hemorrhage, hydrocephalus, extra-axial collection or mass lesion/mass effect. Small remote infarcts seen in the left occipital lobe and right cerebellum. 8 mm dural based calcification along the lateral left temporal lobe, stable since 2005. Generalized atrophy with secondary ventriculomegaly, stable. Vascular: Atherosclerotic calcification.  No hyperdense vessel. Skull: No acute finding Sinuses/Orbits: Bilateral cataract resection IMPRESSION: 1. No acute finding. 2. Atrophy and small remote infarcts. Electronically Signed   By: Monte Fantasia M.D.   On: 12/09/16 08:17   Ct Head Wo Contrast  Result Date: 11/06/2016 CLINICAL DATA:  Patient found on floor. EXAM: CT HEAD WITHOUT CONTRAST TECHNIQUE: Contiguous axial images were obtained from the base of the skull through the vertex without intravenous contrast. COMPARISON:  CT scan of February 15, 2016. FINDINGS: Brain: Mild chronic ischemic white matter disease is noted. Mild diffuse cortical atrophy is noted. No mass effect or midline shift is noted. Ventricular size is within normal limits. There is no evidence of mass lesion, hemorrhage or acute infarction. Vascular: No hyperdense vessel or unexpected calcification. Skull: Normal. Negative for fracture or focal lesion. Sinuses/Orbits: No acute finding. Other: None. IMPRESSION: Mild chronic ischemic white matter disease. Mild diffuse cortical atrophy. No acute intracranial abnormality seen. Electronically Signed   By: Marijo Conception, M.D.   On: 11/06/2016 09:08   Mr Brain Wo Contrast  Result Date: 12-09-2016 CLINICAL DATA:  Right leg weakness since yesterday evening and 9 EXAM: MRI HEAD WITHOUT CONTRAST TECHNIQUE: Multiplanar, multiecho pulse sequences of the  brain and surrounding structures were obtained without intravenous contrast. COMPARISON:  02/17/2016 FINDINGS: Brain: No acute infarct or hemorrhage. Tiny diffusion hyperintensities in the right occipital pole are at previous cortical infarct and attributed shine through based on ADC map. Generalized atrophy with sulcal widening and ventriculomegaly. Small remote infarcts in both occipital poles, right cerebellum, superficial left cerebellum, and bilateral thalamus. Negative for mass or hydrocephalus. Chronic subcentimeter extra-axial calcification along the lateral left temporal convexity, stable since at least 2005. Vascular: Preserved flow voids Skull and upper cervical spine: No marrow lesion noted. Diffuse facet arthropathy and disc degeneration. Indentation of the calvarium at the vertex, likely posttraumatic. This is not localized to the parietal foramina. Sinuses/Orbits: Bilateral cataract resection.  No acute finding IMPRESSION: 1. No acute finding, including infarct. 2. Small remote posterior circulation infarcts which occurred in 2017. 3. Generalized atrophy. Electronically Signed   By: Monte Fantasia M.D.   On: 12/09/2016 09:44   Dg Chest Port 1 View  Result Date: 11/06/2016 CLINICAL DATA:  Pain following fall.  Shortness of breath. EXAM: PORTABLE CHEST 1 VIEW COMPARISON:  February 15, 2016 and October 23, 2013 FINDINGS: There is focal interstitial fibrosis in the bases, stable. Lungs elsewhere clear. Heart is mildly enlarged with pulmonary vascularity within normal limits. No adenopathy. There is atherosclerotic calcification in the aorta. No bone lesions. IMPRESSION: Stable bibasilar interstitial fibrosis. No edema or consolidation. Stable cardiac prominence. There is aortic atherosclerosis. Electronically Signed   By: Lowella Grip III M.D.   On: 11/06/2016 09:12   Ct Maxillofacial Wo Contrast  Result Date: 12-09-16 CLINICAL DATA:  Seizure. EXAM: CT HEAD WITHOUT CONTRAST CT MAXILLOFACIAL  WITHOUT CONTRAST TECHNIQUE: Multidetector CT imaging of the head and maxillofacial structures were performed using the standard protocol without intravenous contrast. Multiplanar CT image reconstructions of the maxillofacial structures were also  generated. COMPARISON:  Brain MRI and head CT from earlier today FINDINGS: CT HEAD FINDINGS Brain: Massive mixed density subdural hematoma along the left cerebral convexity which measures up to 31 mm in thickness. There is severe mass effect on the left cerebral hemisphere with subfalcine and left uncal herniation. The right lateral ventricle is entrapped and dilated. Midline shift measures 24 mm. Uncal herniation causes severe distortion of the upper brain stem. Scattered subarachnoid hemorrhage, mainly low perisylvian and interhemispheric. Intraventricular hemorrhage in the lateral ventricles. Possible ACA distribution ischemia on the left due to herniation. Small remote infarcts as previously described. Vascular: Atherosclerotic calcification. Skull: Negative for skull fracture. There is swelling along the right temporal scalp. CT MAXILLOFACIAL FINDINGS Osseous: 100% displacement of left condylar neck fracture. The left condyle is anteriorly subluxed. No additional fracture noted. Orbits: Bilateral cataract resection.  No posttraumatic finding. Sinuses: Negative for hemosinus. Soft tissues: Soft tissue contusion over the right chin. Possible swelling of the nasal bridge. Right-sided lip laceration Critical Value/emergent results were called by telephone at the time of interpretation on 2016-12-03 at 12:47 pm to Dr. Jeanell Sparrow , who verbally acknowledged these results. IMPRESSION: 1. Massive acute subdural hematoma along the left cerebral convexity with subfalcine and left uncal herniation compressing the midbrain. Midline shift measures 24 mm and right lateral ventricle is entrapped. 2. Scattered acute subarachnoid hemorrhage. Acute hemorrhage in the lateral ventricles. 3. Left  mandibular condyle neck fracture, displaced. 4. Right temporal scalp and chin contusion.  Lip laceration. Electronically Signed   By: Monte Fantasia M.D.   On: Dec 03, 2016 13:05    Microbiology No results found for this or any previous visit (from the past 240 hour(s)).  Lab Basic Metabolic Panel:  Recent Labs Lab Dec 03, 2016 0616 Dec 03, 2016 0626  NA 135 136  K 3.7 3.7  CL 103 104  CO2 22  --   GLUCOSE 109* 116*  BUN 16 17  CREATININE 1.05 1.00  CALCIUM 8.6*  --    Liver Function Tests:  Recent Labs Lab 12-03-2016 0616  AST 17  ALT 13*  ALKPHOS 81  BILITOT 0.7  PROT 6.0*  ALBUMIN 3.3*   No results for input(s): LIPASE, AMYLASE in the last 168 hours. No results for input(s): AMMONIA in the last 168 hours. CBC:  Recent Labs Lab 2016-12-03 0616 2016/12/03 0626  WBC 5.4  --   NEUTROABS 3.3  --   HGB 10.9* 11.2*  HCT 33.6* 33.0*  MCV 94.1  --   PLT 171  --    Cardiac Enzymes: No results for input(s): CKTOTAL, CKMB, CKMBINDEX, TROPONINI in the last 168 hours. Sepsis Labs:  Recent Labs Lab 12/03/16 0616  WBC 5.4    Procedures/Operations  None   Truett Mainland 2016/12/03, 5:29 PM

## 2016-12-18 NOTE — ED Notes (Signed)
ENT in route, plastics tray, sterile gloves and suture cart at bedside for use.

## 2016-12-18 NOTE — Progress Notes (Signed)
Patient came to [place] as new admission from ED at 1400 to [employer] with family at bedside. Patient came to [place] as comfort care per family's wishes. The events that took place during his MRI is noted by MD. Pt went to MRI prior to coming to unit and seized and had head trauma and head bleed which led to familly wanting him to be DNR and comfort care. Once at the unit, pt was cleaned up and made comfortable, suction at bedside. Family refused comfort O2, pt not in respiratory distress. Administered 2mg  morphine. Then at 1710 patient deceased. RN went through death protocol and post mortem care. Family unsure about which funeral home to use at first. Later decide on [place] on [place] called and notified medical examiner, Manuela Neptune about pt's situation. She will see pt tomorrow. [facility] will notify risk management. Any other questions should be referred to medical examiner.

## 2016-12-18 NOTE — ED Notes (Signed)
Neurologist updated on pt unequal pupils 31mm left 59mm right

## 2016-12-18 NOTE — Significant Event (Addendum)
Patient developed episode of clonic tonic seizure during MRI. He lacerated the scalp in the right parietal region, bit the right corner of the lower lip and broke the tooth. Neurology started IV Keppra. Repeat head CT showed hemorrhage with midline shift of 24 mm and uncal herniation Dr. Constance Holster - ENT specialist, contacted for lip laceration Patient is unresponsive, family updated on the events and understand the gravity of the prognosis

## 2016-12-18 NOTE — H&P (Signed)
History and Physical    Craig Richard DVV:[id number] DOB: 04/04/1922 DOA: Dec 24, 2016  PCP: [name], MD   Patient coming from: Rehab facility  Chief Complaint: right leg wekaness  HPI: Craig Richard is a 83 y.o. male with medical history significant Afib - on Eliquis anticoagulation therapy, embolic stroke in June 6269 (MRI in June 2017 showed showed multiple areas of acute strokes in cerebellum and bilateral occipital regions, MRA revealed high-grade LICA stenosis), CAD, HLD and HTN who presented to the ED with c/o right leg weakness.   Patient noted that since about 7 pm last night he developed progressive weakness of the right leg and started drugging it being unable to lift the tip of the foot from the floor d/t heavy sensation. He denied any back pain, or injury, no upper extremity weakness or speech difficulty. He is now working with PT at home and had a long session yesterday so the family thought he was tired after that. He was helped to go to bed by his daughter and managed to get up from the bed in the middle of the night without assistance, but  Was unable to get himself of the commode, being unable to bear weight on the right leg d/t feeling severe weakness  Patient had a fall approximately two weeks ago and was seen in the ED. During that visit he had been seen by neuro-hospitalist, Dr. Shon Hale  who recommended continue Eliquis, control BP, glucose level and lipids as needed. In follow up he was seen by Dr. Leonie Man in the office who felt like patient might have had TIA or small stroke that was not vizualized by CT Off note, patient discontinued Eliquis on 11/04/2016 morning d/t dental procedure and restarted on 3/192018 morning dose.  ED Course: on arrival his BP was accelerated - 165/98-187-109, otherwise VS ertr uremarkable Blood work demonstrated Hgb 10.9, Hct 33.6, Glucose 109 Head CT revealed old infarcts, no hemorrhages EKG showed Afib with controlled heart rate, no  acute ST-TW changes  Review of Systems: As per HPI otherwise 10 point review of systems negative.   Ambulatory Status: Patient uses cane or walker for ambulation  Past Medical History:  Diagnosis Date  . Atrial fibrillation (HCC)    coumadin - follow by Reinaldo Meeker at [facility]  . CAD (coronary artery disease)   . History of anterior wall myocardial infarction 09/17/2008  . History of nuclear stress test 06/14/2010   bruce protocol myoview; stress images show perfusion defect in apical wall (scar)  . Hyperlipidemia   . Hypertension   . Stroke [facility])     Past Surgical History:  Procedure Laterality Date  . CORONARY ANGIOPLASTY  09/17/2008   r/t acute anterior wall MI; apical LAD lesion  . ESOPHAGOGASTRODUODENOSCOPY N/A 10/29/2012   Procedure: ESOPHAGOGASTRODUODENOSCOPY (EGD);  Surgeon: Arta Silence, MD;  Location: [facility];  Service: Endoscopy;  Laterality: N/A;  . EYE SURGERY Bilateral    cataract  . [place]  2005  . HOT HEMOSTASIS N/A 10/29/2012   Procedure: HOT HEMOSTASIS (ARGON PLASMA COAGULATION/BICAP);  Surgeon: Arta Silence, MD;  Location: [facility];  Service: Endoscopy;  Laterality: N/A;  . PROSTATE SURGERY  2000  . TRANSTHORACIC ECHOCARDIOGRAM  06/29/2010   EF=>55%, normal LV systolic function; LA & RA mod dilated; mild mitral annular calcif & mild MR; mod TR & elevated RV systolic pressure (mild pulm HTN); AV mildly sclerotic; mild pulm valve regurg    Social History   Social History  . Marital status:  Widowed    Spouse name: N/A  . Number of children: 2  . Years of education: B.S. [employer]   Occupational History  .  Retired   Social History Main Topics  . Smoking status: Never Smoker  . Smokeless tobacco: Current User    Types: Chew     Comment: occasionally  . Alcohol use No  . Drug use: No  . Sexual activity: Not on file   Other Topics Concern  . Not on file   Social History Narrative  . No narrative on file    No Known  Allergies  Family History  Problem Relation Age of Onset  . Stroke Mother   . Aneurysm Father     abdominal  . Emphysema Sister   . Cancer Brother   . Heart disease Brother   . Cancer Sister   . Cancer Maternal Grandmother     Prior to Admission medications   Medication Sig Start Date End Date Taking? Authorizing Provider  apixaban (ELIQUIS) 2.5 MG TABS tablet Take 1 tablet (2.5 mg total) by mouth 2 (two) times daily. 02/19/16  Yes Karmen Bongo, MD  atorvastatin (LIPITOR) 40 MG tablet Take 40 mg by mouth every other day.   Yes Historical Provider, MD  Fe Fum-FePoly-Vit C-Vit B3 (INTEGRA) 62.5-62.5-40-3 MG CAPS Take 1 capsule by mouth every other day.  11/03/16  Yes Historical Provider, MD  isosorbide mononitrate (IMDUR) 60 MG 24 hr tablet Take 60 mg by mouth daily before breakfast.   Yes Historical Provider, MD  metoprolol (LOPRESSOR) 50 MG tablet Take 75 mg by mouth 2 (two) times daily.   Yes Historical Provider, MD  nitroGLYCERIN (NITROSTAT) 0.4 MG SL tablet Place 1 tablet (0.4 mg total) under the tongue every 5 (five) minutes as needed for chest pain. 06/07/14  Yes Isaiah Serge, NP  ramipril (ALTACE) 10 MG capsule Take 10 mg by mouth daily.   Yes Historical Provider, MD  vitamin B-12 1000 MCG tablet Take 1 tablet (1,000 mcg total) by mouth daily. 02/19/16  Yes Karmen Bongo, MD    Physical Exam: Vitals:   12-05-2016 0535 December 05, 2016 0537 12-05-2016 0716 Dec 05, 2016 0730  BP:  (!) 165/98 (!) 187/109 (!) 178/90  Pulse:  74 69 71  Resp:  (!) 24 18 15   Temp:  97.7 F (36.5 C)    SpO2:  100% 100% 100%  Weight: 74.4 kg (164 lb)     Height: 5\' 10"  (1.778 m)        General: Appears calm and comfortable Eyes: PERRLA, EOMI, normal lids, iris, slight right facial droop noticed ENT:  grossly normal hearing, lips & tongue, mucous membranes moist and intact Neck: no lymphoadenopathy, masses or thyromegaly Cardiovascular: RRR, no m/r/g. No JVD, carotid bruits. No LE edema.  Respiratory:  bilateral no wheezes, rales, rhonchi or cracles. Normal respiratory effort. No accessory muscle use observed Abdomen: soft, non-tender, non-distended, no organomegaly or masses appreciated. BS present in all quadrants Skin: no rash, ulcers or induration seen on limited exam Musculoskeletal: grossly normal tone BUE/BLE, good ROM, no bony abnormality or joint deformities observed Psychiatric: grossly normal mood and affect, speech fluent and appropriate, alert and oriented x3 Neurologic: CN II-XII grossly intact, moves upper and left lower extremities independently, right leg with reduced strenghth - patient is unable to hold leg in elevated position and drops it on the bed, no clonus observedc, sensation intact  Labs on Admission: I have personally reviewed following labs and imaging studies  CBC, BMP  GFR: Estimated Creatinine Clearance: 47.7 mL/min (by C-G formula based on SCr of 1 mg/dL).  Creatinine Clearance: Estimated Creatinine Clearance: 47.7 mL/min (by C-G formula based on SCr of 1 mg/dL).  Radiological Exams on Admission: No results found.  EKG: Independently reviewed - EKG with controlled rate, no acute ischemic changes Assessment/Plan Principal Problem:   TIA (transient ischemic attack) Active Problems:   CAD in native artery, wth hx ant MI, wth PTCA to LAD, 2010   Atrial fibrillation (HCC)   Hyperlipidemia   Hypertension   History of stroke    TIA vs acute stroke manifested by severe right sided LE weakness  Brain MRI is pending Neurology consult requested by EDP Continue Eliquis, obtain PT/OT evaluation and case management consul  Afib with controlled heart rate Continue Eliquis, monitor on telemetry   HTN - accelerated Start home  Meds and allow permissive hypertension  Hyperlipidemia Continue statin therapy  CAD with h/o MI - patient is stable, has no anginal symptoms    DVT prophylaxis: Eliquis Code Status: full Family Communication: at bedside   Disposition Plan: telemetrry Consults called: neuro by EDP Admission status: observation   York Grice, [place] Pager: [phone] [facility]  If 7PM-7AM, please contact night-coverage [email or web address] Password [email or web address]  November 29, 2016, 8:06 AM

## 2016-12-18 NOTE — Consult Note (Signed)
Reason for Consult: Complex lip laceration Referring Physician: Truett Mainland, DO  Craig Richard is an 83 y.o. male.  HPI: Patient had a stroke earlier in the day, and was in the MRI scanner when he had a seizure and suffered head injury and complex laceration to the lip.  Past Medical History:  Diagnosis Date  . Atrial fibrillation (HCC)    coumadin - follow by Reinaldo Meeker at [facility]  . CAD (coronary artery disease)   . History of anterior wall myocardial infarction 09/17/2008  . History of nuclear stress test 06/14/2010   bruce protocol myoview; stress images show perfusion defect in apical wall (scar)  . Hyperlipidemia   . Hypertension   . Stroke [facility])     Past Surgical History:  Procedure Laterality Date  . CORONARY ANGIOPLASTY  09/17/2008   r/t acute anterior wall MI; apical LAD lesion  . ESOPHAGOGASTRODUODENOSCOPY N/A 10/29/2012   Procedure: ESOPHAGOGASTRODUODENOSCOPY (EGD);  Surgeon: Arta Silence, MD;  Location: [facility];  Service: Endoscopy;  Laterality: N/A;  . EYE SURGERY Bilateral    cataract  . [place]  2005  . HOT HEMOSTASIS N/A 10/29/2012   Procedure: HOT HEMOSTASIS (ARGON PLASMA COAGULATION/BICAP);  Surgeon: Arta Silence, MD;  Location: [facility];  Service: Endoscopy;  Laterality: N/A;  . PROSTATE SURGERY  2000  . TRANSTHORACIC ECHOCARDIOGRAM  06/29/2010   EF=>55%, normal LV systolic function; LA & RA mod dilated; mild mitral annular calcif & mild MR; mod TR & elevated RV systolic pressure (mild pulm HTN); AV mildly sclerotic; mild pulm valve regurg    Family History  Problem Relation Age of Onset  . Stroke Mother   . Aneurysm Father     abdominal  . Emphysema Sister   . Cancer Brother   . Heart disease Brother   . Cancer Sister   . Cancer Maternal Grandmother     Social History:  reports that he has never smoked. His smokeless tobacco use includes Chew. He reports that he does not drink alcohol or use drugs.  Allergies: No  Known Allergies  Medications: Reviewed  Results for orders placed or performed during the hospital encounter of 12-20-2016 (from the past 48 hour(s))  Ethanol     Status: None   Collection Time: 20-Dec-2016  6:16 AM  Result Value Ref Range   Alcohol, Ethyl (B) <5 <5 mg/dL    Comment:        LOWEST DETECTABLE LIMIT FOR SERUM ALCOHOL IS 5 mg/dL FOR MEDICAL PURPOSES ONLY   Protime-INR     Status: None   Collection Time: 20-Dec-2016  6:16 AM  Result Value Ref Range   Prothrombin Time 14.5 11.4 - 15.2 seconds   INR 1.13   APTT     Status: None   Collection Time: 12-20-2016  6:16 AM  Result Value Ref Range   aPTT 30 24 - 36 seconds  CBC     Status: Abnormal   Collection Time: 12/20/16  6:16 AM  Result Value Ref Range   WBC 5.4 4.0 - 10.5 K/uL   RBC 3.57 (L) 4.22 - 5.81 MIL/uL   Hemoglobin 10.9 (L) 13.0 - 17.0 g/dL   HCT 33.6 (L) 39.0 - 52.0 %   MCV 94.1 78.0 - 100.0 fL   MCH 30.5 26.0 - 34.0 pg   MCHC 32.4 30.0 - 36.0 g/dL   RDW 13.2 11.5 - 15.5 %   Platelets 171 150 - 400 K/uL  Differential     Status: None  Collection Time: Dec 24, 2016  6:16 AM  Result Value Ref Range   Neutrophils Relative % 62 %   Lymphocytes Relative 22 %   Monocytes Relative 15 %   Eosinophils Relative 1 %   Basophils Relative 0 %   Neutro Abs 3.3 1.7 - 7.7 K/uL   Lymphs Abs 1.2 0.7 - 4.0 K/uL   Monocytes Absolute 0.8 0.1 - 1.0 K/uL   Eosinophils Absolute 0.1 0.0 - 0.7 K/uL   Basophils Absolute 0.0 0.0 - 0.1 K/uL   RBC Morphology POLYCHROMASIA PRESENT     Comment: ELLIPTOCYTES OVAL MACROCYTES   Comprehensive metabolic panel     Status: Abnormal   Collection Time: 2016/12/24  6:16 AM  Result Value Ref Range   Sodium 135 135 - 145 mmol/L   Potassium 3.7 3.5 - 5.1 mmol/L   Chloride 103 101 - 111 mmol/L   CO2 22 22 - 32 mmol/L   Glucose, Bld 109 (H) 65 - 99 mg/dL   BUN 16 6 - 20 mg/dL   Creatinine, Ser 1.05 0.61 - 1.24 mg/dL   Calcium 8.6 (L) 8.9 - 10.3 mg/dL   Total Protein 6.0 (L) 6.5 - 8.1 g/dL    Albumin 3.3 (L) 3.5 - 5.0 g/dL   AST 17 15 - 41 U/L   ALT 13 (L) 17 - 63 U/L   Alkaline Phosphatase 81 38 - 126 U/L   Total Bilirubin 0.7 0.3 - 1.2 mg/dL   GFR calc non Af Amer 59 (L) >60 mL/min   GFR calc Af Amer >60 >60 mL/min    Comment: (NOTE) The eGFR has been calculated using the CKD EPI equation. This calculation has not been validated in all clinical situations. eGFR's persistently <60 mL/min signify possible Chronic Kidney Disease.    Anion gap 10 5 - 15  I-stat troponin, ED (not at [facility], [facility])     Status: None   Collection Time: 12/24/16  6:25 AM  Result Value Ref Range   Troponin i, poc 0.02 0.00 - 0.08 ng/mL   Comment 3            Comment: Due to the release kinetics of cTnI, a negative result within the first hours of the onset of symptoms does not rule out myocardial infarction with certainty. If myocardial infarction is still suspected, repeat the test at appropriate intervals.   I-Stat Chem 8, ED  (not at [facility], [facility])     Status: Abnormal   Collection Time: 2016/12/24  6:26 AM  Result Value Ref Range   Sodium 136 135 - 145 mmol/L   Potassium 3.7 3.5 - 5.1 mmol/L   Chloride 104 101 - 111 mmol/L   BUN 17 6 - 20 mg/dL   Creatinine, Ser 1.00 0.61 - 1.24 mg/dL   Glucose, Bld 116 (H) 65 - 99 mg/dL   Calcium, Ion 1.11 (L) 1.15 - 1.40 mmol/L   TCO2 23 0 - 100 mmol/L   Hemoglobin 11.2 (L) 13.0 - 17.0 g/dL   HCT 33.0 (L) 39.0 - 52.0 %  Urine rapid drug screen (hosp performed)not at [facility]     Status: None   Collection Time: Dec 24, 2016  7:24 AM  Result Value Ref Range   Opiates NONE DETECTED NONE DETECTED   Cocaine NONE DETECTED NONE DETECTED   Benzodiazepines NONE DETECTED NONE DETECTED   Amphetamines NONE DETECTED NONE DETECTED   Tetrahydrocannabinol NONE DETECTED NONE DETECTED   Barbiturates NONE DETECTED NONE DETECTED    Comment:  DRUG SCREEN FOR MEDICAL PURPOSES ONLY.  IF CONFIRMATION IS NEEDED FOR ANY PURPOSE, NOTIFY LAB WITHIN 5 DAYS.        LOWEST  DETECTABLE LIMITS FOR URINE DRUG SCREEN Drug Class       Cutoff (ng/mL) Amphetamine      1000 Barbiturate      200 Benzodiazepine   275 Tricyclics       170 Opiates          300 Cocaine          300 THC              50   Urinalysis, Routine w reflex microscopic     Status: None   Collection Time: 12-09-2016  7:24 AM  Result Value Ref Range   Color, Urine YELLOW YELLOW   APPearance CLEAR CLEAR   Specific Gravity, Urine 1.013 1.005 - 1.030   pH 6.0 5.0 - 8.0   Glucose, UA NEGATIVE NEGATIVE mg/dL   Hgb urine dipstick NEGATIVE NEGATIVE   Bilirubin Urine NEGATIVE NEGATIVE   Ketones, ur NEGATIVE NEGATIVE mg/dL   Protein, ur NEGATIVE NEGATIVE mg/dL   Nitrite NEGATIVE NEGATIVE   Leukocytes, UA NEGATIVE NEGATIVE  CBG monitoring, ED     Status: Abnormal   Collection Time: 12/09/16 10:26 AM  Result Value Ref Range   Glucose-Capillary 123 (H) 65 - 99 mg/dL    Ct Head Wo Contrast  Result Date: 12-09-16 CLINICAL DATA:  Awoke with right-sided weakness. EXAM: CT HEAD WITHOUT CONTRAST TECHNIQUE: Contiguous axial images were obtained from the base of the skull through the vertex without intravenous contrast. COMPARISON:  11/06/2016 FINDINGS: Brain: No evidence of acute infarction, hemorrhage, hydrocephalus, extra-axial collection or mass lesion/mass effect. Small remote infarcts seen in the left occipital lobe and right cerebellum. 8 mm dural based calcification along the lateral left temporal lobe, stable since 2005. Generalized atrophy with secondary ventriculomegaly, stable. Vascular: Atherosclerotic calcification.  No hyperdense vessel. Skull: No acute finding Sinuses/Orbits: Bilateral cataract resection IMPRESSION: 1. No acute finding. 2. Atrophy and small remote infarcts. Electronically Signed   By: Monte Fantasia M.D.   On: Dec 09, 2016 08:17   Mr Brain Wo Contrast  Result Date: 2016/12/09 CLINICAL DATA:  Right leg weakness since yesterday evening and 9 EXAM: MRI HEAD WITHOUT CONTRAST  TECHNIQUE: Multiplanar, multiecho pulse sequences of the brain and surrounding structures were obtained without intravenous contrast. COMPARISON:  02/17/2016 FINDINGS: Brain: No acute infarct or hemorrhage. Tiny diffusion hyperintensities in the right occipital pole are at previous cortical infarct and attributed shine through based on ADC map. Generalized atrophy with sulcal widening and ventriculomegaly. Small remote infarcts in both occipital poles, right cerebellum, superficial left cerebellum, and bilateral thalamus. Negative for mass or hydrocephalus. Chronic subcentimeter extra-axial calcification along the lateral left temporal convexity, stable since at least 2005. Vascular: Preserved flow voids Skull and upper cervical spine: No marrow lesion noted. Diffuse facet arthropathy and disc degeneration. Indentation of the calvarium at the vertex, likely posttraumatic. This is not localized to the parietal foramina. Sinuses/Orbits: Bilateral cataract resection.  No acute finding IMPRESSION: 1. No acute finding, including infarct. 2. Small remote posterior circulation infarcts which occurred in 2017. 3. Generalized atrophy. Electronically Signed   By: Monte Fantasia M.D.   On: 2016-12-09 09:44    YFV:[id number] except as listed in admit H&P  Blood pressure (!) 187/120, pulse 91, temperature 97.7 F (36.5 C), resp. rate 19, height _0  (1.778 m), weight 74.4 kg (164 lb), SpO2 98 %.  PHYSICAL EXAM: Overall appearance:  Supine, asleep Through and through lip laceration right lower lip. Multiple contusions and abrasions around the face.  Studies Reviewed: CT head  Procedures: Repair of lip laceration.  Xylocaine with epinephrine was infiltrated into the lower lip on both sides of the wound. 4-0 chromic suture was used to reapproximate the edges. The wound was closed satisfactorily. There was minimal Bleeding.   Assessment/Plan: Lip laceration, complex, repaired. During the procedure, the results  of the CT returned and the patient has a massive intracranial hemorrhage and is going to be treated with palliative care only.  Heyli Min Dec 03, 2016, 1:03 PM

## 2016-12-18 NOTE — ED Provider Notes (Signed)
[facility] DEPT Provider Note   CSN: [id number] Arrival date & time: 12-15-2016  3785     History   Chief Complaint Chief Complaint  Patient presents with  . Extremity Weakness    HPI Craig Richard is a 83 y.o. male.  83 yo M with a cc of RLE weakness.  Going on since yesterday evening about 9pm.  Denies back pain, denies fever.  Denies numbness or tingling.  Denies difficulty with speech.  [place] a couple of weeks ago.  Seen by neuro and thought to be a possible stroke as he had been off his eliquis for a dental procedure. Heavy PT session yesterday so they though he was tired.  Started dragging his leg.  Went to the bathroom but was unable to stand.     The history is provided by the patient.  Cerebrovascular Accident  This is a new problem. The current episode started yesterday. The problem occurs constantly. The problem has not changed since onset.Pertinent negatives include no chest pain, no abdominal pain, no headaches and no shortness of breath. Nothing aggravates the symptoms. Nothing relieves the symptoms. He has tried nothing for the symptoms. The treatment provided no relief.    Past Medical History:  Diagnosis Date  . Atrial fibrillation (HCC)    coumadin - follow by Reinaldo Meeker at [facility]  . CAD (coronary artery disease)   . History of anterior wall myocardial infarction 09/17/2008  . History of nuclear stress test 06/14/2010   bruce protocol myoview; stress images show perfusion defect in apical wall (scar)  . Hyperlipidemia   . Hypertension   . Stroke [facility])     Patient Active Problem List   Diagnosis Date Noted  . 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 Acute cerebrovascular accident (CVA) ([place]) 02/17/2016  . Acute encephalopathy   . Altered level of consciousness 02/15/2016  . Delirium 02/15/2016  . CAD in native artery, wth hx ant MI, wth PTCA to LAD, 2010 06/09/2013  . Atrial fibrillation ([place])   . Hyperlipidemia   . Hypertension       Past Surgical History:  Procedure Laterality Date  . CORONARY ANGIOPLASTY  09/17/2008   r/t acute anterior wall MI; apical LAD lesion  . ESOPHAGOGASTRODUODENOSCOPY N/A 10/29/2012   Procedure: ESOPHAGOGASTRODUODENOSCOPY (EGD);  Surgeon: Arta Silence, MD;  Location: [facility];  Service: Endoscopy;  Laterality: N/A;  . EYE SURGERY Bilateral    cataract  . [place]  2005  . HOT HEMOSTASIS N/A 10/29/2012   Procedure: HOT HEMOSTASIS (ARGON PLASMA COAGULATION/BICAP);  Surgeon: Arta Silence, MD;  Location: [facility];  Service: Endoscopy;  Laterality: N/A;  . PROSTATE SURGERY  2000  . TRANSTHORACIC ECHOCARDIOGRAM  06/29/2010   EF=>55%, normal LV systolic function; LA & RA mod dilated; mild mitral annular calcif & mild MR; mod TR & elevated RV systolic pressure (mild pulm HTN); AV mildly sclerotic; mild pulm valve regurg       Home Medications    Prior to Admission medications   Medication Sig Start Date End Date Taking? Authorizing Provider  apixaban (ELIQUIS) 2.5 MG TABS tablet Take 1 tablet (2.5 mg total) by mouth 2 (two) times daily. 02/19/16  Yes Karmen Bongo, MD  atorvastatin (LIPITOR) 40 MG tablet Take 40 mg by mouth every other day.   Yes Historical Provider, MD  Fe Fum-FePoly-Vit C-Vit B3 (INTEGRA) 62.5-62.5-40-3 MG CAPS Take 1 capsule by mouth every other day.  11/03/16  Yes Historical Provider, MD  isosorbide mononitrate (IMDUR)  60 MG 24 hr tablet Take 60 mg by mouth daily before breakfast.   Yes Historical Provider, MD  metoprolol (LOPRESSOR) 50 MG tablet Take 75 mg by mouth 2 (two) times daily.   Yes Historical Provider, MD  nitroGLYCERIN (NITROSTAT) 0.4 MG SL tablet Place 1 tablet (0.4 mg total) under the tongue every 5 (five) minutes as needed for chest pain. 06/07/14  Yes Isaiah Serge, NP  ramipril (ALTACE) 10 MG capsule Take 10 mg by mouth daily.   Yes Historical Provider, MD  vitamin B-12 1000 MCG tablet Take 1 tablet (1,000 mcg total) by mouth daily.  02/19/16  Yes Karmen Bongo, MD    Family History Family History  Problem Relation Age of Onset  . Stroke Mother   . Aneurysm Father     abdominal  . Emphysema Sister   . Cancer Brother   . Heart disease Brother   . Cancer Sister   . Cancer Maternal Grandmother     Social History Social History  Substance Use Topics  . Smoking status: Never Smoker  . Smokeless tobacco: Current User    Types: Chew     Comment: occasionally  . Alcohol use No     Allergies   Patient has no known allergies.   Review of Systems Review of Systems  Constitutional: Negative for chills and fever.  HENT: Negative for congestion and facial swelling.   Eyes: Negative for discharge and visual disturbance.  Respiratory: Negative for shortness of breath.   Cardiovascular: Negative for chest pain and palpitations.  Gastrointestinal: Negative for abdominal pain, diarrhea and vomiting.  Musculoskeletal: Negative for arthralgias and myalgias.  Skin: Negative for color change and rash.  Neurological: Positive for weakness. Negative for tremors, syncope and headaches.  Psychiatric/Behavioral: Negative for confusion and dysphoric mood.     Physical Exam Updated Vital Signs BP (!) 165/98   Pulse 74   Temp 97.7 F (36.5 C)   Resp (!) 24   Ht 5\' 10"  (1.778 m)   Wt 164 lb (74.4 kg)   SpO2 100%   BMI 23.53 kg/m   Physical Exam  Constitutional: He is oriented to person, place, and time. He appears well-developed and well-nourished.  HENT:  Head: Normocephalic and atraumatic.  Eyes: EOM are normal. Pupils are equal, round, and reactive to light.  Neck: Normal range of motion. Neck supple. No JVD present.  Cardiovascular: Normal rate and regular rhythm.  Exam reveals no gallop and no friction rub.   No murmur heard. Pulmonary/Chest: No respiratory distress. He has no wheezes.  Abdominal: He exhibits no distension and no mass. There is no tenderness. There is no rebound and no guarding.    Musculoskeletal: Normal range of motion.  Neurological: He is alert and oriented to person, place, and time. No sensory deficit. GCS eye subscore is 4. GCS verbal subscore is 5. GCS motor subscore is 6. He displays no Babinski's sign on the right side. He displays no Babinski's sign on the left side.  ? R facial droop.  RLE 3/5 muscle strength.  No clonus, negative babinski.  Unable to access coordination to the RLE as unable to lift off the bed.   Skin: No rash noted. No pallor.  Psychiatric: He has a normal mood and affect. His behavior is normal.  Nursing note and vitals reviewed.    ED Treatments / Results  Labs (all labs ordered are listed, but only abnormal results are displayed) Labs Reviewed  CBC - Abnormal; Notable for the  following:       Result Value   RBC 3.57 (*)    Hemoglobin 10.9 (*)    HCT 33.6 (*)    All other components within normal limits  COMPREHENSIVE METABOLIC PANEL - Abnormal; Notable for the following:    Glucose, Bld 109 (*)    Calcium 8.6 (*)    Total Protein 6.0 (*)    Albumin 3.3 (*)    ALT 13 (*)    GFR calc non Af Amer 59 (*)    All other components within normal limits  I-STAT CHEM 8, ED - Abnormal; Notable for the following:    Glucose, Bld 116 (*)    Calcium, Ion 1.11 (*)    Hemoglobin 11.2 (*)    HCT 33.0 (*)    All other components within normal limits  ETHANOL  PROTIME-INR  APTT  DIFFERENTIAL  RAPID URINE DRUG SCREEN, HOSP PERFORMED  URINALYSIS, ROUTINE W REFLEX MICROSCOPIC  I-STAT TROPOININ, ED    EKG  EKG Interpretation  Date/Time:  Sunday November 25 2016 05:39:36 EDT Ventricular Rate:  67 PR Interval:    QRS Duration: 93 QT Interval:  439 QTC Calculation: 464 R Axis:   -16 Text Interpretation:  Atrial fibrillation Borderline left axis deviation Borderline low voltage, extremity leads No significant change since last tracing Confirmed by Victoria Henshaw MD, DANIEL ([id number]) on 12/04/2016 5:47:12 AM       Radiology No results  found.  Procedures Procedures (including critical care time)  Medications Ordered in ED Medications - No data to display   Initial Impression / Assessment and Plan / ED Course  I have reviewed the triage vital signs and the nursing notes.  Pertinent labs & imaging results that were available during my care of the patient were reviewed by me and considered in my medical decision making (see chart for details).     83  yo M with a cc of RLE weakness.  Going on since yesterday afternoon.  No noted back pain.  Will obtain CT head, discuss with neuro.   CT With no significant bleeding. Discussed with neurology. They're unsure if this is a specific stroke syndrome is the patient's weakness seemed progressive since yesterday. Recommended an MRI and placed in the hospital for further workup.  The patients results and plan were reviewed and discussed.   Any x-rays performed were independently reviewed by myself.   Differential diagnosis were considered with the presenting HPI.  Medications - No data to display  Vitals:   12/24/16 0532 2016-12-24 0535 2016-12-24 0537  BP:   (!) 165/98  Pulse:   74  Resp:   (!) 24  Temp:   97.7 F (36.5 C)  SpO2: 99%  100%  Weight:  164 lb (74.4 kg)   Height:  5\' 10"  (1.778 m)     Final diagnoses:  Weakness of right lower extremity    Admission/ observation were discussed with the admitting physician, patient and/or family and they are comfortable with the plan.    Final Clinical Impressions(s) / ED Diagnoses   Final diagnoses:  Weakness of right lower extremity    New Prescriptions New Prescriptions   No medications on file     Deno Etienne, DO 2016/12/24 [id number]

## 2016-12-18 NOTE — ED Notes (Signed)
Patient transported to CT 

## 2016-12-18 NOTE — ED Notes (Signed)
Admitting MD reevaluated the patient.

## 2016-12-18 NOTE — Progress Notes (Signed)
Notified of CT results after head trauma due to seizure in the MRI scanner. CT shows massive acute subdural hematoma along the left cerebral convexity with midline shift of 24 mm with uncal herniation causing severe distortion of the upper brainstem. He also has acute subarachnoid hemorrhage. There is beginnings of ACA distribution ischemia due to the herniation. I had a conversation with the patient's family along with Dr. Shon Hale of neurology. I discussed with him possibilities of evacuation, which would include reversal of the patient's anticoagulation and discussing the patient with neurosurgery. We discussed that the outcome would likely be poor given the presence of ischemic changes. The regions family declined, preferring that we keep the patient comfortable and proceed with comfort measures only.  We'll change but request and discontinue all invasive orders. Will offer Ativan and morphine as needed for pain and discomfort.  Craig Mainland, DO Nov 27, 2016 1:17 PM

## 2016-12-18 NOTE — Progress Notes (Addendum)
Chaplain responded to request to make contact with family during EOL care.  Patient's two daughters, granddaughter, a friend and two pastors were present.  Chaplain offered support and empathy and indicated our ongoing availability during this time.  Family appeared calm, mutually supportive and grieving appropriately over difficult day and in anticipation of death.  Please call as needed or requested.  Luana Shu [phone]    12-01-16 1500  Clinical Encounter Type  Visited With Patient and family together  Visit Type Initial;Death  Referral From Nurse  Consult/Referral To Chaplain  Spiritual Encounters  Spiritual Needs Grief support  Stress Factors  Family Stress Factors Loss of control;Major life changes

## 2016-12-18 NOTE — ED Notes (Signed)
PT transported to CT, temporary dressings applied to right posterior head and to right lower lip.

## 2016-12-18 NOTE — Consult Note (Signed)
Neurology Consult Note  Reason for Consultation: RLE weakness  Requesting provider: Deno Etienne, MD  CC: Patient currently encephalopathic after possible seizure in MRI  HPI: This is a Craig Richard who was brought to the ED by his family for the evaluation of R leg weakness. History is obtained from the patient's family as he is presently encephalopathic and unable to provide.   The patient's daughters reported that he began having some right leg weakness last night around 8 or 9 PM. This seemed to be mild at first. However, this gradually worsened overnight and this morning he was essentially unable to bear weight with his right lower extremity. They decided to bring him to the emergency department for further evaluation. When he was examined by the ED attending, he was noted to have a possible right facial droop with 3/5 strength in the right leg. The remainder of his neurologic examination was unrevealing. He was sent for CT scan of the head and MRI scan of the brain to exclude possible acute ischemic infarction.  I evaluated the patient upon his return from MRI. When I walked in the room, the nurse was applying pressure to a laceration the patient had on his lip. The nurse went on to say that when the patient left for MRI, he was alert and appropriate. However, when he returned, he was very confused. The MRI technologist reported to the nurse that the patient had some movement of both legs while he is in the scanner but when they asked him to hold still, he did. When they removed him from the scanner, the patient was noticeably altered with a laceration to the right lower lip, a new abrasion/contusion to the right parietal scalp, and some abrasions on the right knee. None of these were present before his scan. Based upon this information, it was suspeted that he may have suffered a seizure while in the MRI scanner. When I evaluated him, he would moan and mumble but was essentially nonverbal. He did not  clearly follow commands. Pupils were equal and he moved all four extremities spontaneously and purposefully. MRI brain did not show any acute pathology.  Shortly after my exam, while I was in the emergency room seeing a different patient, his nurse approached me and reported that the patient now had asymmetric pupils with the L dilated and poorly reactive. He was taken for an emergent CT scan of the head. Unfortunately, this CT scan showed a massive acute right-sided subdural hematoma with extensive herniation and shift resulting in evidence of ischemia to the upper brainstem. After reviewing the scan, I went with the patient's admitting attending, Dr. Nehemiah Settle, to discuss the results with the family. They were informed of the hematoma and resultant mass effect with herniation. They were advised that this is not a survivable injury and that unfortunately there is no intervention that is likely to change his outcome given the early evidence of ischemia in the brainstem. His family indicated that he would not want any aggressive measures in this setting as he always feared being dependent. They have elected to proceed with the withdrawal of life-sustaining measures. The patient was made DO NOT RESUSCITATE and transferred to the medical floor for further management.  PMH:  Past Medical History:  Diagnosis Date  . Atrial fibrillation (HCC)    coumadin - follow by Reinaldo Meeker at [facility]  . CAD (coronary artery disease)   . History of anterior wall myocardial infarction 09/17/2008  . History of nuclear stress  test 06/14/2010   bruce protocol myoview; stress images show perfusion defect in apical wall (scar)  . Hyperlipidemia   . Hypertension   . Stroke [facility])     PSH:  Past Surgical History:  Procedure Laterality Date  . CORONARY ANGIOPLASTY  09/17/2008   r/t acute anterior wall MI; apical LAD lesion  . ESOPHAGOGASTRODUODENOSCOPY N/A 10/29/2012   Procedure: ESOPHAGOGASTRODUODENOSCOPY (EGD);  Surgeon:  Arta Silence, MD;  Location: [facility];  Service: Endoscopy;  Laterality: N/A;  . EYE SURGERY Bilateral    cataract  . [place]  2005  . HOT HEMOSTASIS N/A 10/29/2012   Procedure: HOT HEMOSTASIS (ARGON PLASMA COAGULATION/BICAP);  Surgeon: Arta Silence, MD;  Location: [facility];  Service: Endoscopy;  Laterality: N/A;  . PROSTATE SURGERY  2000  . TRANSTHORACIC ECHOCARDIOGRAM  06/29/2010   EF=>55%, normal LV systolic function; LA & RA mod dilated; mild mitral annular calcif & mild MR; mod TR & elevated RV systolic pressure (mild pulm HTN); AV mildly sclerotic; mild pulm valve regurg    Family history: Family History  Problem Relation Age of Onset  . Stroke Mother   . Aneurysm Father     abdominal  . Emphysema Sister   . Cancer Brother   . Heart disease Brother   . Cancer Sister   . Cancer Maternal Grandmother     Social history:  Social History   Social History  . Marital status: Widowed    Spouse name: N/A  . Number of children: 2  . Years of education: B.S. [employer]   Occupational History  .  Retired   Social History Main Topics  . Smoking status: Never Smoker  . Smokeless tobacco: Current User    Types: Chew     Comment: occasionally  . Alcohol use No  . Drug use: No  . Sexual activity: Not on file   Other Topics Concern  . Not on file   Social History Narrative  . No narrative on file    Current outpatient meds: Medications reviewed and reconciled.  Current Meds  Medication Sig  . apixaban (ELIQUIS) 2.5 MG TABS tablet Take 1 tablet (2.5 mg total) by mouth 2 (two) times daily.  Marland Kitchen atorvastatin (LIPITOR) 40 MG tablet Take 40 mg by mouth every other day.  . Fe Fum-FePoly-Vit C-Vit B3 (INTEGRA) 62.5-62.5-40-3 MG CAPS Take 1 capsule by mouth every other day.   . isosorbide mononitrate (IMDUR) 60 MG 24 hr tablet Take 60 mg by mouth daily before breakfast.  . metoprolol (LOPRESSOR) 50 MG tablet Take 75 mg by mouth 2 (two) times daily.  .  nitroGLYCERIN (NITROSTAT) 0.4 MG SL tablet Place 1 tablet (0.4 mg total) under the tongue every 5 (five) minutes as needed for chest pain.  . ramipril (ALTACE) 10 MG capsule Take 10 mg by mouth daily.  . vitamin B-12 1000 MCG tablet Take 1 tablet (1,000 mcg total) by mouth daily.    Current inpatient meds: Medications reviewed and reconciled.  Current Facility-Administered Medications  Medication Dose Route Frequency Provider Last Rate Last Dose  .  stroke: mapping our early stages of recovery book   Does not apply Once Marina S Kyazimova, PA-C      . 0.9 %  sodium chloride infusion   Intravenous Continuous Brenton Grills, PA-C      . acetaminophen (TYLENOL) tablet 650 mg  650 mg Oral Q6H PRN Brenton Grills, PA-C       Or  . acetaminophen (TYLENOL) suppository 650 mg  650 mg Rectal Q6H PRN Brenton Grills, PA-C      . apixaban Arne Cleveland) tablet 2.5 mg  2.5 mg Oral BID Brenton Grills, PA-C      . atorvastatin (LIPITOR) tablet 40 mg  40 mg Oral QODAY Brenton Grills, PA-C      . HYDROcodone-acetaminophen (NORCO/VICODIN) 5-325 MG per tablet 1-2 tablet  1-2 tablet Oral Q4H PRN Brenton Grills, PA-C      . isosorbide mononitrate (IMDUR) 24 hr tablet 60 mg  60 mg Oral QAC breakfast Brenton Grills, PA-C      . levETIRAcetam (KEPPRA) 500 mg in sodium chloride 0.9 % 100 mL IVPB  500 mg Intravenous Q12H Darrel Reach, MD      . LORazepam (ATIVAN) bolus via infusion 1 mg  1 mg Intravenous PRN Brenton Grills, PA-C      . LORazepam (ATIVAN) injection 0.5 mg  0.5 mg Intravenous Once Darrel Reach, MD      . metoprolol tartrate (LOPRESSOR) tablet 75 mg  75 mg Oral BID Brenton Grills, PA-C      . nitroGLYCERIN (NITROSTAT) SL tablet 0.4 mg  0.4 mg Sublingual Q5 min PRN Brenton Grills, PA-C      . ondansetron [facility]) tablet 4 mg  4 mg Oral Q6H PRN Brenton Grills, PA-C       Or  . ondansetron [facility]) injection 4 mg  4 mg Intravenous Q6H PRN Brenton Grills, PA-C      . polyethylene glycol (MIRALAX / GLYCOLAX) packet 17 g  17 g Oral Daily PRN Brenton Grills, PA-C      . ramipril (ALTACE) capsule 10 mg  10 mg Oral Daily Brenton Grills, PA-C      . sodium chloride flush (NS) 0.9 % injection 3 mL  3 mL Intravenous Q12H Marina S Kyazimova, PA-C      . traZODone (DESYREL) tablet 25 mg  25 mg Oral QHS PRN Brenton Grills, PA-C      . vitamin B-12 (CYANOCOBALAMIN) tablet 1,000 mcg  1,000 mcg Oral Daily Brenton Grills, PA-C       Current Outpatient Prescriptions  Medication Sig Dispense Refill  . apixaban (ELIQUIS) 2.5 MG TABS tablet Take 1 tablet (2.5 mg total) by mouth 2 (two) times daily. 60 tablet 0  . atorvastatin (LIPITOR) 40 MG tablet Take 40 mg by mouth every other day.    . Fe Fum-FePoly-Vit C-Vit B3 (INTEGRA) 62.5-62.5-40-3 MG CAPS Take 1 capsule by mouth every other day.   3  . isosorbide mononitrate (IMDUR) 60 MG 24 hr tablet Take 60 mg by mouth daily before breakfast.    . metoprolol (LOPRESSOR) 50 MG tablet Take 75 mg by mouth 2 (two) times daily.    . nitroGLYCERIN (NITROSTAT) 0.4 MG SL tablet Place 1 tablet (0.4 mg total) under the tongue every 5 (five) minutes as needed for chest pain. 25 tablet 6  . ramipril (ALTACE) 10 MG capsule Take 10 mg by mouth daily.    . vitamin B-12 1000 MCG tablet Take 1 tablet (1,000 mcg total) by mouth daily. 30 tablet 0    Allergies: No Known Allergies  ROS: As per HPI. A full 14-point review of systems Could not be obtained as the patient is encephalopathic and unable to provide.   PE:  (initial) BP (!) 191/126   Pulse (!) 119   Temp 97.7 F (36.5 C)   Resp 18   Ht  5\' 10"  (1.778 m)   Wt 74.4 kg (164 lb)   SpO2 97%   BMI 23.53 kg/m   General: WDWN elderly Richard lying on ED gurney. He is poorly responsive. He will open his eyes and seems to look around but doesn't really fix or track. He did not follow any commands. He is largely nonverbal but moans at times. On occasion  he mumbles but this is unintelligible. He has a complex laceration to the right lower lip which is bleeding lightly.  HEENT: He has a complex laceration to the right lower lip that is bleeding lightly. A small amount of blood was noted coming from the right naris. There is an abrasion/contusion in the right parietal scalp. He has evidence of ecchymosis under the chin. Right upper tooth #7 was fractured and hanging loosely. Sclerae anicteric. No conjunctival injection.  CV: Irregularly irregular, no murmur. Carotid pulses full and symmetric, no bruits. Distal pulses 2+ and symmetric.  Lungs: CTAB on anterior exam.  Abdomen: Soft, non-distended, non-tender. Bowel sounds present x4.  Extremities: No C/C. He has moderate pitting edema in the left lower extremity. Neuro:  CN: Pupils are equal and round. They are sluggishly reactive from 2-->1 mm. eyes are conjugate. He looks from side to side volitionally with intact smooth pursuits. Corneals appear to be symmetric. There may be some flattening of the right nasolabial fold. Grimace appears to be largely symmetric, however. The remainder of his cranial nerves cannot be accurately assessed as he does not participate with the examination.  Motor: Normal bulk. He has increased tone in the left upper extremity. He moves all 4 extremities spontaneously, arms more than legs. No focal weakness is appreciated. However, he does not participate with confrontational strength testing. He restlessly picks at things and reaches out with his right hand. No tremor or other abnormal movements.  Sensation: He grimaces and withdraws from noxious stimuli 4. DTRs: Brisk 2+, symmetric. Toes mute bilaterally.  Coordination: He does not participate with formal testing but no overt dysmetria seen with spontaneous movements of the arms. Gait: Deferred as he is not able to participate at this time.   Labs:  Lab Results  Component Value Date   WBC 5.4 12/07/16   HGB 11.2 (L)  December 07, 2016   HCT 33.0 (L) 2016/12/07   PLT 171 Dec 07, 2016   GLUCOSE 116 (H) 12-07-2016   CHOL 106 02/17/2016   TRIG 50 02/17/2016   HDL 32 (L) 02/17/2016   LDLCALC 64 02/17/2016   ALT 13 (L) 12/07/2016   AST 17 12-07-2016   NA 136 12/07/16   K 3.7 12/07/16   CL 104 2016/12/07   CREATININE 1.00 2016/12/07   BUN 17 2016/12/07   CO2 22 12/07/16   TSH 1.955 02/15/2016   INR 1.13 12-07-16   HGBA1C 5.7 (H) 02/17/2016   Urinalysis negative Urine drug screen negative Troponin 0.02 Serum ethanol less than 5  Imaging:  I have personally and independently reviewed the CT scan of the head without contrast from today at 7:42 AM. This shows moderate diffuse generalized atrophy. A mild degree of chronic small vessel ischemic disease is present in the bihemispheric white matter. Old infarctions are noted in the left occipital lobe and right cerebellum. No obvious acute abnormality is seen.  I have personally and independently reviewed the MRI scan of the brain without contrast from today at 9:05 AM. There are no areas of restricted diffusion to suggest acute ischemia. He has moderate diffuse generalized atrophy with hydrocephalus ex-vacuo.  Focal areas of encephalomalacia are seen in both occipital lobes, both cerebellar hemispheres, and bilateral thalami consistent with old infarcts. Moderate small vessel disease is noted.  I have personally and independently reviewed CT scan of the head without contrast from today at 12:26 PM. This shows a large acute subdural hematoma measuring 31 mm at its thickest point. This overlies the left cerebral convexity extending down into the middle cranial fossa as well as back along the parietal lobe. This results in severe mass effect and 24 mm of left-to-right midline shift. There is subfalcine and uncal herniation with compression of the upper brainstem. There is obliteration of the perimesencephalic cisterns and third ventricle. The midbrain and pons appear  hypodense, suggesting probable ischemia. There are scattered areas of subarachnoid hemorrhage as well involving the perisylvian regions and left frontal lobe. Hemorrhage is also noted in both lateral ventricles.  Assessment and Plan:  1. Acute subdural hematoma: This is a massive hemorrhage, likely due to trauma sustained during presumed seizure in the MRI scanner earlier today. Unfortunately, the patient is anticoagulated which likely contributed to the size and extent of his bleeding. This is compounded by additional areas of subarachnoid hemorrhage as well as intraventricular extension. Given the degree of mass effect and brainstem compromise, there is no role for surgical intervention at this time. This is a terminal insult. Family has decided to proceed with comfort measures which is appropriate at this time.  2. Cerebral herniation: There is acute subfalcine and uncal herniation caused by massive left subdural hematoma. This has resulted in brainstem compromise with compression and ischemia of the midbrain and pons. As noted above, this is a terminal insult. No intervention available that will have any favorable impact on outcome. Comfort measures.  3. Coma: The patient is presently unresponsive, due to cerebral herniation. As noted above, no available intervention at this time. Comfort measures.  4. Probable seizure: History as reported is concerning for possible seizure that occurred in the MRI scanner earlier today. This resulted in some superficial facial and scalp injuries consistent with the patient striking his head in the scanner. This likely resulted in his subdural hematoma as well. At this point, no particular interventions. Family has decided to pursue comfort measures.  This was discussed with the patient's two daughters and niece. Education was provided on the diagnosis and available treatment options. They are in agreement with the plan as noted. They were given the opportunity to ask  any questions and these were addressed to their satisfaction. As noted above, they report that the patient would not want heroic interventions in this setting as he does not want to be dependent. They report that his greatest fear was having a stroke that left him incapable of caring for himself. They have elected to proceed with comfort measures which I believe is completely appropriate in this setting.  I discussed the case with Drs. Stinson and [employer]. All are in agreement with the plan as noted.  This patient is critically ill and at significant risk of neurological worsening, death and care requires constant monitoring of vital signs, hemodynamics,respiratory and cardiac monitoring, neurological assessment, discussion with family, other specialists and medical decision making of high complexity. A total of 80 minutes of critical care time was spent on this case.

## 2016-12-18 NOTE — ED Triage Notes (Signed)
Pt had right leg weakness that started around 1900 yesterday. Pt has a history of strokes. No weakness anywhere else and no facial drop.

## 2016-12-18 NOTE — ED Notes (Signed)
Neurology at bedside.

## 2016-12-18 NOTE — ED Provider Notes (Signed)
Asked to see patient by rn.  Patient holding for medicine admission.  Patient had mri ordered by admitting team and returned with ams, head laceration, mouth trauma, knee abrasion and confused.  Family at bedside.  Brief history- patient here with left leg weakness and being evaluated for stroke, on eliquis but had been off for dental procedure recently.  Patient has had several episodes of decreased responsiveness at home followed by confusion prior to this episode with leg weakness.  Given patient's new injuries, it would be likely that patient had seizure and is now post ictal.  BS reported normal.  RIght scalp abrasion- recommend repeat head ct Complicated lower lip laceration- will need repair Right upper tooth number 7- fractured and only held in place by small piece of tissue- tooth removed to avoid aspiration- does not appear to be reimplantable.  Advise ent or plastics consult for complicated lip laceration Also, patient oozing some blood from right nares and is at risk for bleeding, aspiration given mouth /face/dental injuries and anticoagulation.  ?maxillofacial ct with head ct   Pattricia Boss, MD 11/26/2016 1114

## 2016-12-18 DEATH — deceased

## 2017-01-23 ENCOUNTER — Ambulatory Visit: Payer: Medicare Other | Admitting: Neurology

## 2017-11-29 IMAGING — CT CT HEAD W/O CM
5 of 7 series · 18 of 47 positions shown, 19 images · non-contrast
Comparison: 07/12/2004

CLINICAL DATA: Recent fall with altered level of consciousness

EXAM:
CT HEAD WITHOUT CONTRAST
TECHNIQUE: Contiguous axial images were obtained from the base of the skull
through the vertex without intravenous contrast.

[Series 2: head w/o · axial · non-contrast · 0.45mm/px · z∈[-108,-28]mm · 4 of 28 slices shown, 5 images (1 of 2)]
[im 6/28  brain]
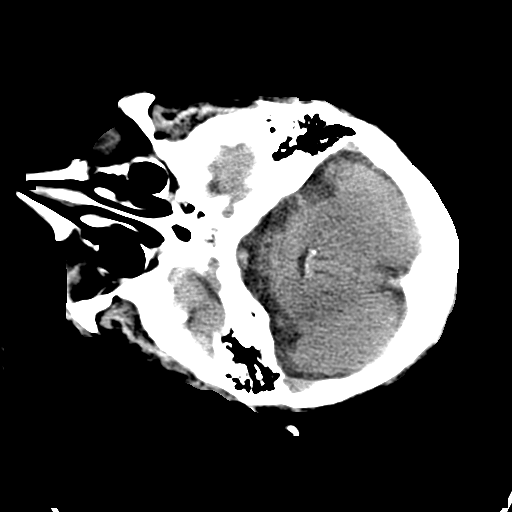
[im 6/28  bone]
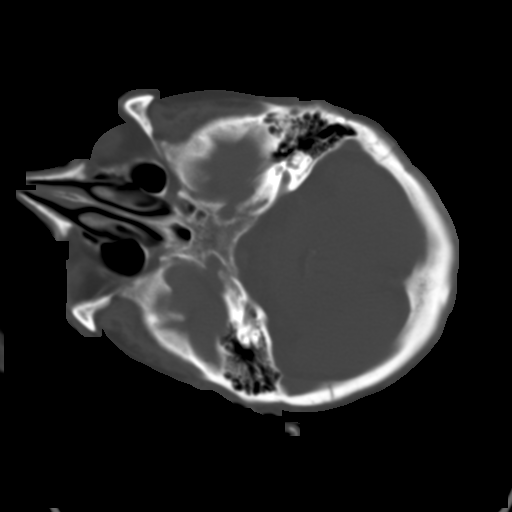
[im 11/28  brain]
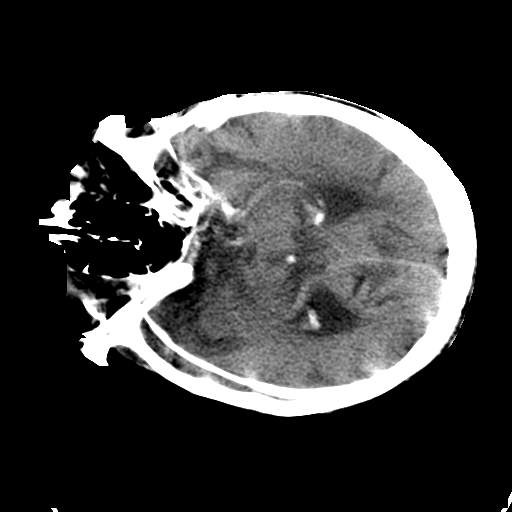
[im 17/28  brain]
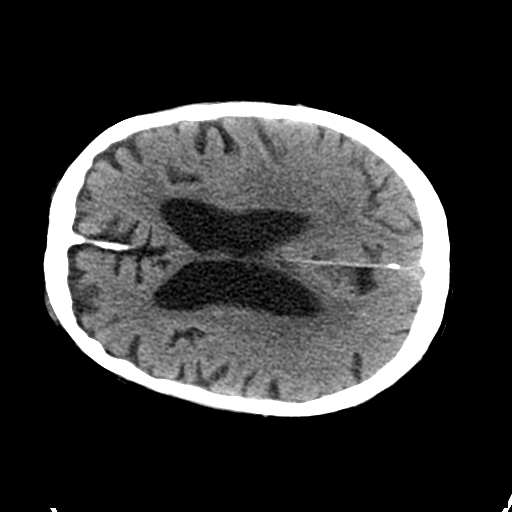
[im 22/28  brain]
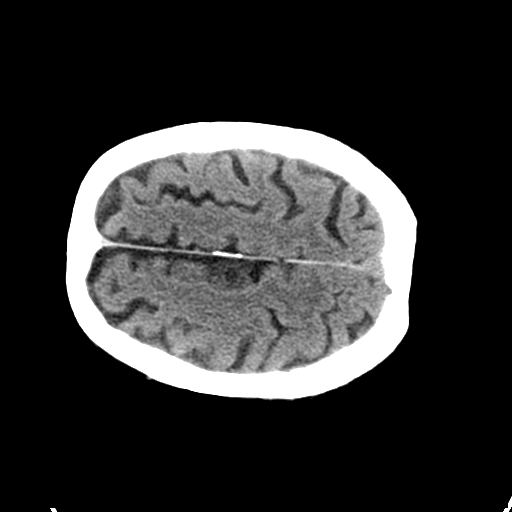

[Series 3: bone windows · axial · 0.45mm/px · z∈[-118,-42]mm · 6 of 46 slices shown]
[im 6/46  bone]
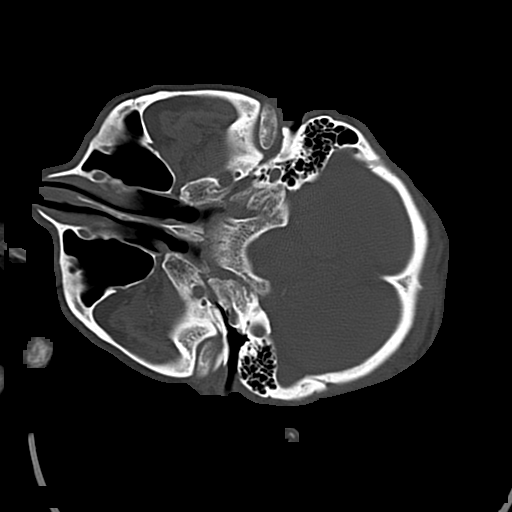
[im 11/46  bone]
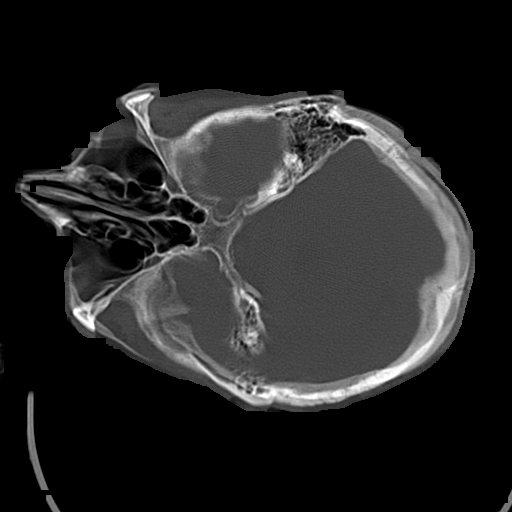
[im 16/46  bone]
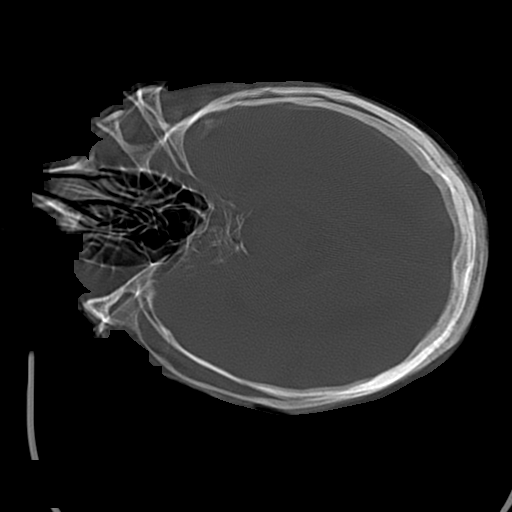
[im 21/46  bone]
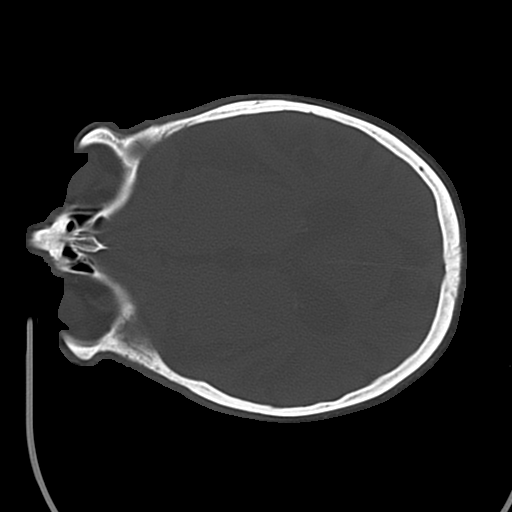
[im 26/46  bone]
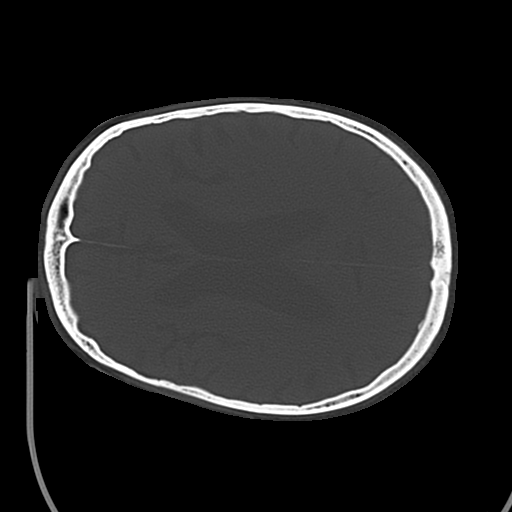
[im 31/46  bone]
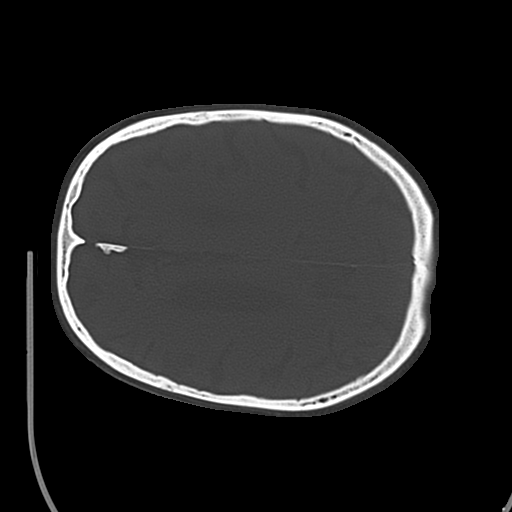

[Series 4: head w/o · axial · non-contrast · 0.46mm/px · z∈[-88,-28]mm · 3 of 24 slices shown (2 of 2)]
[im 6/24  brain]
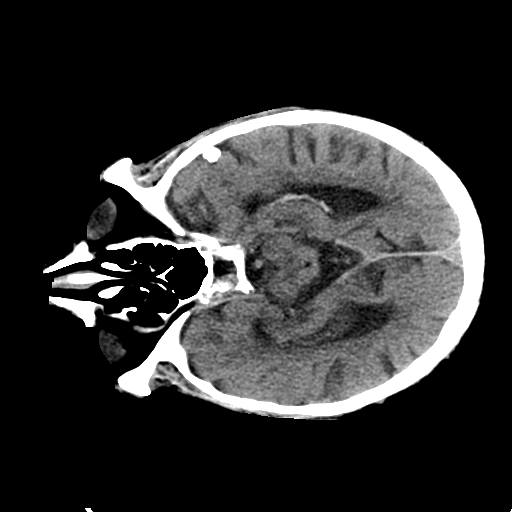
[im 12/24  brain]
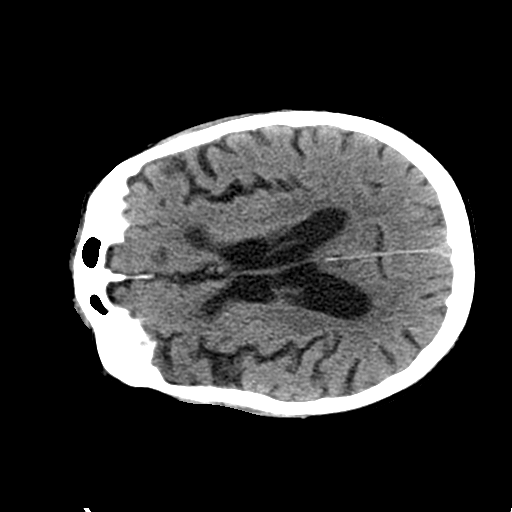
[im 18/24  brain]
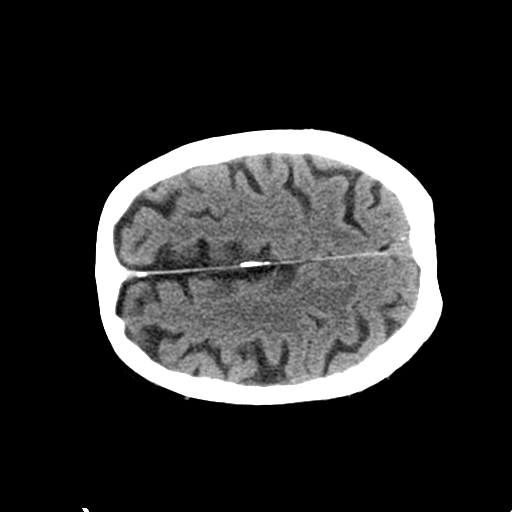

[Series 10: sag thins · sagittal · 0.36mm/px · 2 of 57 slices shown]
[im 19/57  brain]
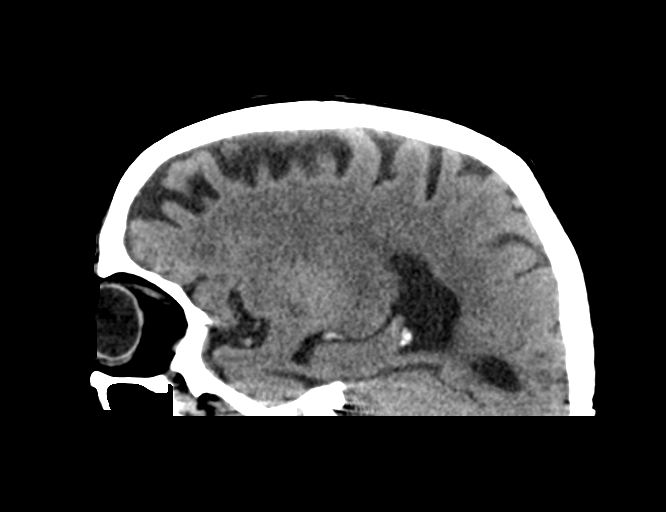
[im 38/57  brain]
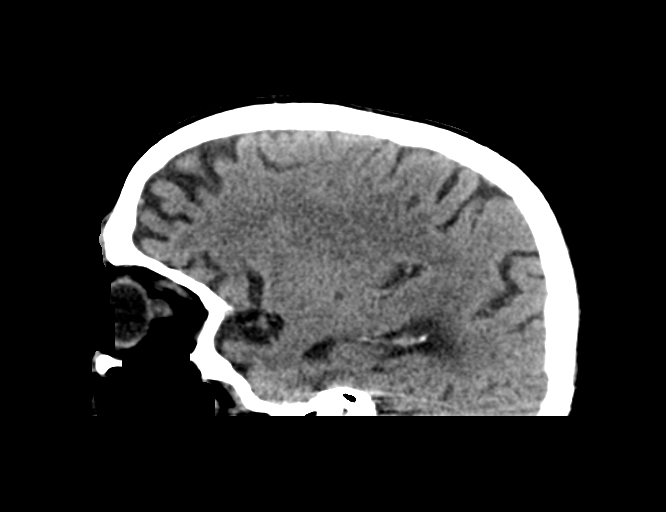

[Series 602: <mpr thick range> · coronal · 0.39mm/px · 3 of 67 slices shown]
[im 23/67  brain]
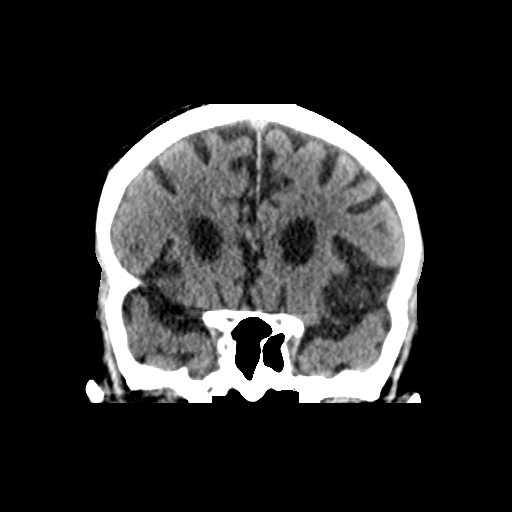
[im 30/67  brain]
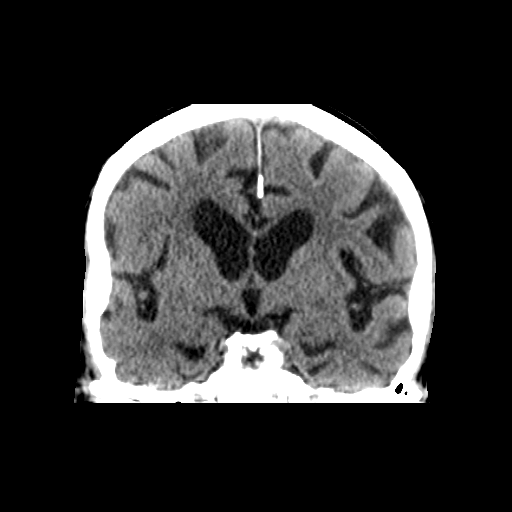
[im 37/67  brain]
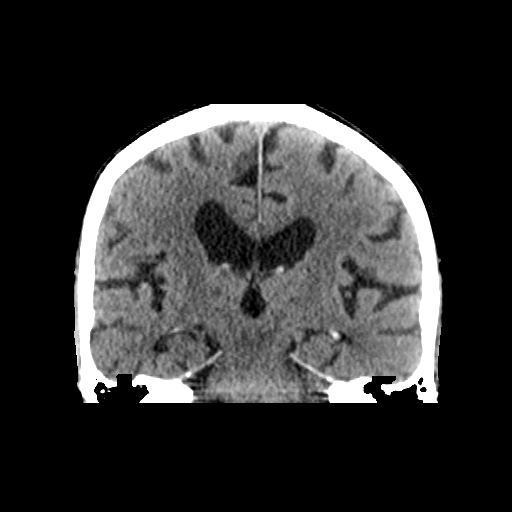

[18 of 47 positions shown; findings below may reference images not displayed]

FINDINGS: Several motion artifact is identified limiting the exam. Mild
atrophic changes are noted. No findings to suggest acute hemorrhage,
acute infarction or space-occupying mass lesion are noted.
IMPRESSION: Mild atrophic changes without acute abnormality.

## 2017-11-29 IMAGING — CR DG CHEST 2V
2 series · 2 of 2 positions shown · non-contrast
Comparison: 10/23/2013

CLINICAL DATA: Worsening confusion.  Altered mental status.

EXAM:
CHEST  2 VIEW

[x chest ap]
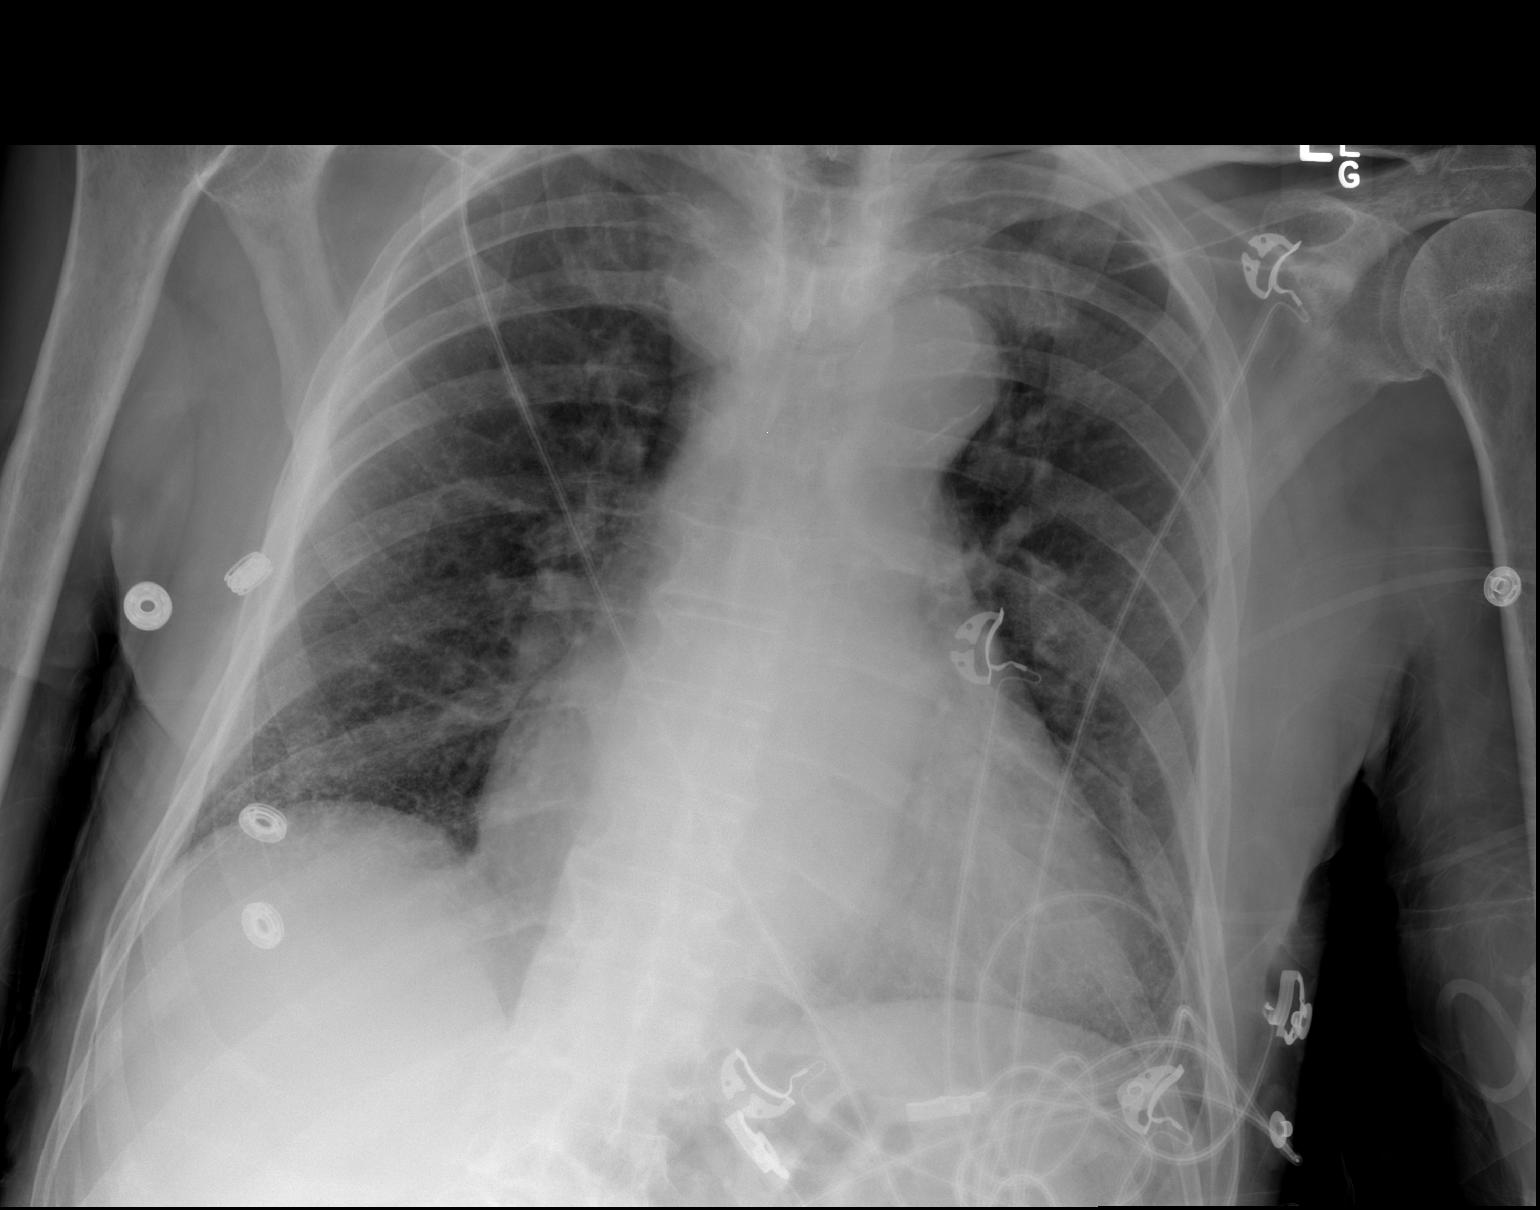

[w chest lat]
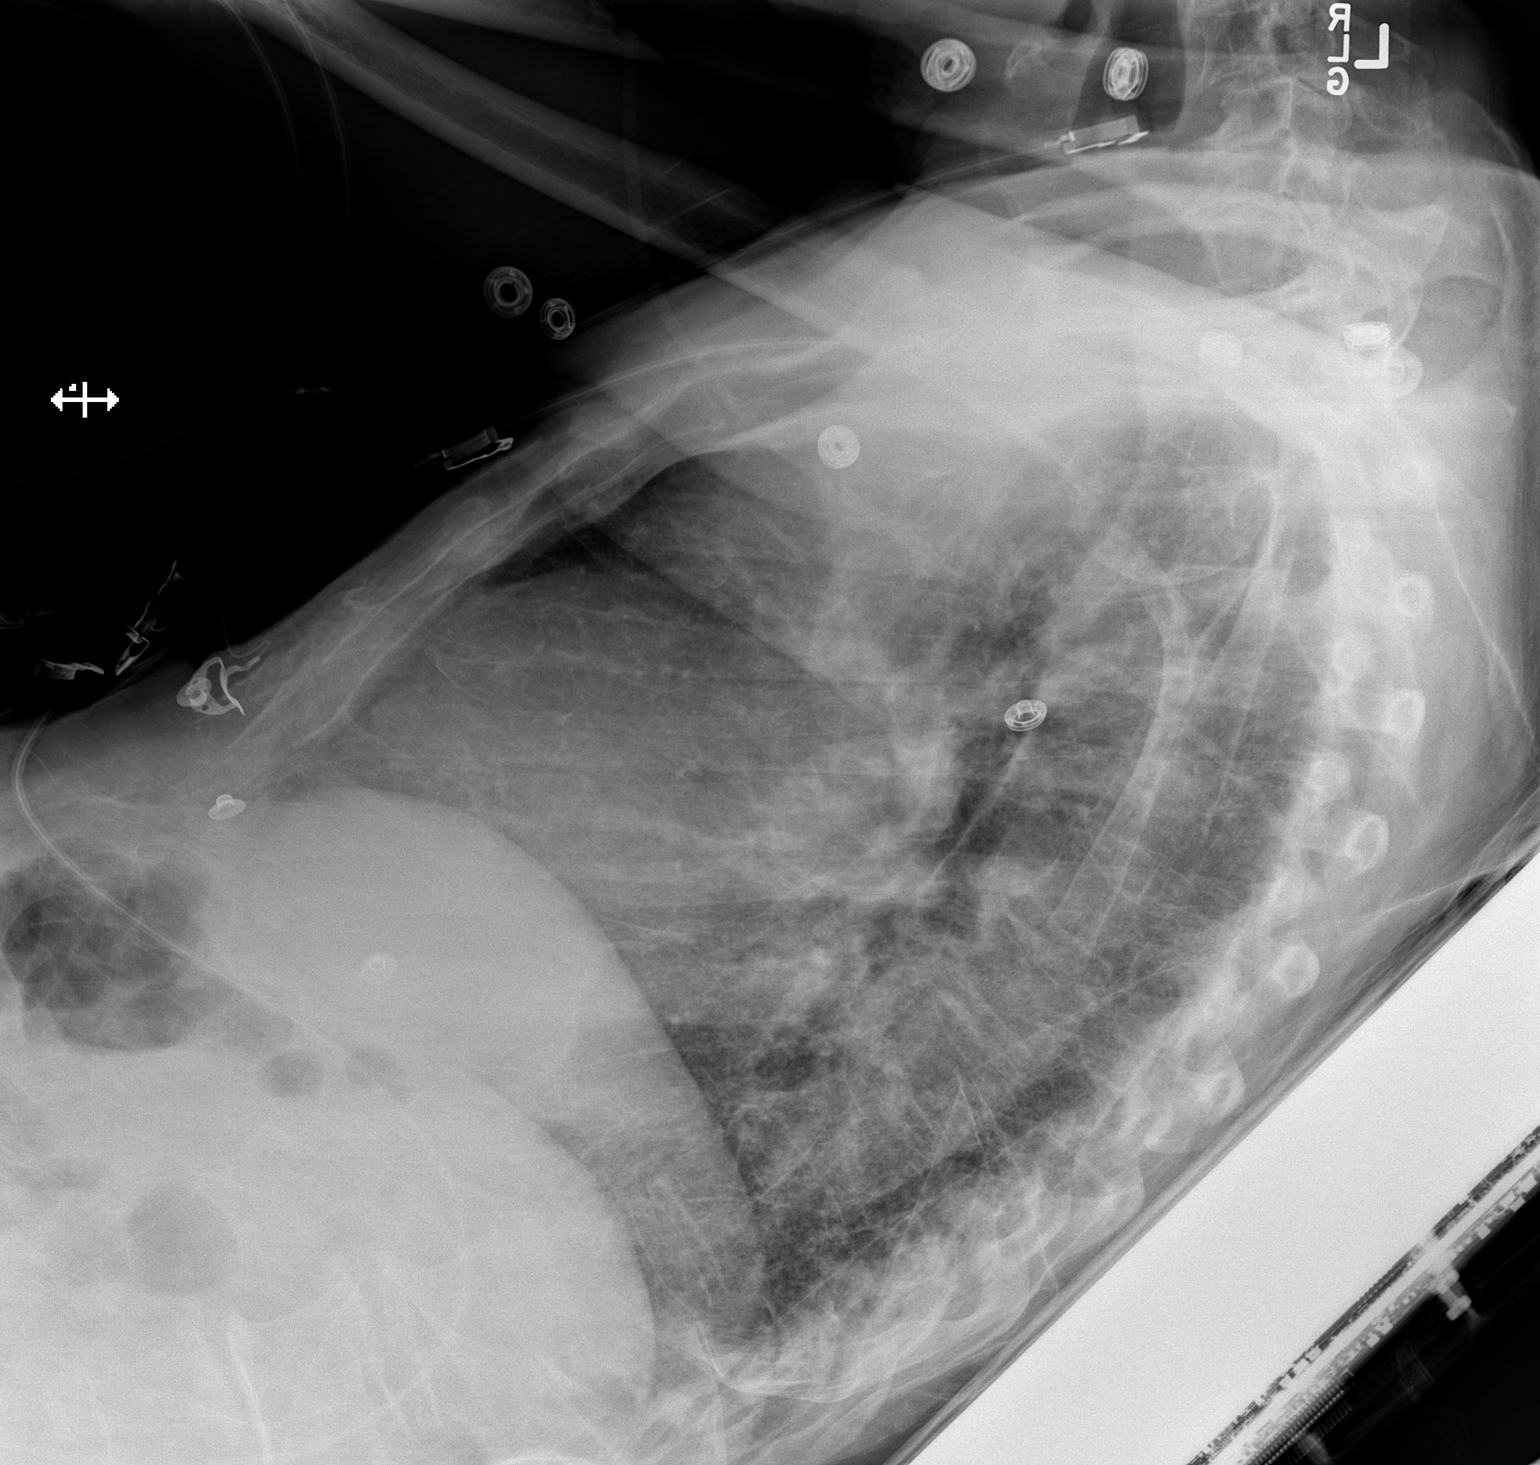

[2 of 2 positions shown; findings below may reference images not displayed]

FINDINGS: Mild cardiomegaly. There is prominence of soft tissue in the right
paratracheal region, not definitively seen on prior study. No focal
airspace opacities or edema. No effusions or acute bony abnormality.
IMPRESSION: Mild cardiomegaly.

Mild prominence of right paratracheal soft tissue. I favor this is
vascular or possibly related to goiter. Mediastinal adenopathy
cannot be completely excluded. This could be further evaluated with
chest CT with IV contrast if felt clinically indicated.
